# Patient Record
Sex: Male | Born: 1980
Health system: Southern US, Community
[De-identification: ages and names within clinical notes are randomized; demographics above are authoritative.]

## PROBLEM LIST (undated history)

## (undated) DIAGNOSIS — J189 Pneumonia, unspecified organism: Secondary | ICD-10-CM

## (undated) DIAGNOSIS — Z720 Tobacco use: Secondary | ICD-10-CM

## (undated) DIAGNOSIS — F419 Anxiety disorder, unspecified: Secondary | ICD-10-CM

## (undated) DIAGNOSIS — F32A Depression, unspecified: Secondary | ICD-10-CM

## (undated) HISTORY — DX: Anxiety disorder, unspecified: F41.9

## (undated) HISTORY — DX: Tobacco use: Z72.0

## (undated) HISTORY — DX: Depression, unspecified: F32.A

---

## 2006-07-23 ENCOUNTER — Inpatient Hospital Stay (HOSPITAL_COMMUNITY): Admission: EM | Admit: 2006-07-23 | Discharge: 2006-07-25 | Payer: Self-pay | Admitting: *Deleted

## 2006-07-23 ENCOUNTER — Ambulatory Visit: Payer: Self-pay | Admitting: Psychiatry

## 2012-02-10 ENCOUNTER — Other Ambulatory Visit: Payer: Self-pay

## 2012-02-10 ENCOUNTER — Emergency Department (HOSPITAL_COMMUNITY): Payer: BC Managed Care – PPO

## 2012-02-10 ENCOUNTER — Encounter (HOSPITAL_COMMUNITY): Payer: Self-pay | Admitting: *Deleted

## 2012-02-10 ENCOUNTER — Emergency Department (HOSPITAL_COMMUNITY)
Admission: EM | Admit: 2012-02-10 | Discharge: 2012-02-10 | Disposition: A | Discharge status: Home / Self Care | Payer: BC Managed Care – PPO | Attending: Emergency Medicine | Admitting: Emergency Medicine

## 2012-02-10 DIAGNOSIS — R0602 Shortness of breath: Secondary | ICD-10-CM | POA: Insufficient documentation

## 2012-02-10 DIAGNOSIS — R61 Generalized hyperhidrosis: Secondary | ICD-10-CM | POA: Insufficient documentation

## 2012-02-10 DIAGNOSIS — R42 Dizziness and giddiness: Secondary | ICD-10-CM | POA: Insufficient documentation

## 2012-02-10 DIAGNOSIS — R079 Chest pain, unspecified: Secondary | ICD-10-CM | POA: Insufficient documentation

## 2012-02-10 DIAGNOSIS — F17200 Nicotine dependence, unspecified, uncomplicated: Secondary | ICD-10-CM | POA: Insufficient documentation

## 2012-02-10 HISTORY — DX: Pneumonia, unspecified organism: J18.9

## 2012-02-10 LAB — POCT I-STAT, CHEM 8
BUN: 10 mg/dL (ref 6–23)
Calcium, Ion: 1.25 mmol/L (ref 1.12–1.32)
Chloride: 103 mEq/L (ref 96–112)
Creatinine, Ser: 0.9 mg/dL (ref 0.50–1.35)
Glucose, Bld: 97 mg/dL (ref 70–99)
Potassium: 4.1 mEq/L (ref 3.5–5.1)

## 2012-02-10 LAB — POCT I-STAT TROPONIN I: Troponin i, poc: 0 ng/mL (ref 0.00–0.08)

## 2012-02-10 MED ORDER — ASPIRIN 81 MG PO CHEW
324.0000 mg | CHEWABLE_TABLET | Freq: Once | ORAL | Status: AC
  Administered 2012-02-10: 324 mg via ORAL
  Filled 2012-02-10: qty 4

## 2012-02-10 MED ORDER — PROPOFOL 10 MG/ML IV EMUL: 5.0000 ug/kg/min | Freq: Once | INTRAVENOUS | Status: DC

## 2012-02-10 NOTE — ED Notes (Signed)
Pt states he was at work when he had sudden onset of right sided chest pain associated with nausea, diaphoresis, and dizziness. States pain last for several minutes but resolved on its on. Denies any chest pain at this time. States i just feel weird.

## 2012-02-10 NOTE — ED Provider Notes (Signed)
History     CSN: [id number]  Arrival date & time 02/10/12  4098   First MD Initiated Contact with Patient 02/10/12 1014      Chief Complaint  Patient presents with  . Chest Pain  . Shortness of Breath    (Consider location/radiation/quality/duration/timing/severity/associated sxs/prior treatment) Patient is a 31 y.o. male presenting with chest pain. The history is provided by the patient.  Chest Pain The chest pain began 1 - 2 hours ago. The chest pain is resolved. The pain is associated with exertion. At its most intense, the pain is at 6/10. The pain is currently at 0/10. The severity of the pain is moderate. The quality of the pain is described as sharp. The pain does not radiate. Primary symptoms include shortness of breath and dizziness. Pertinent negatives for primary symptoms include no fever and no palpitations.   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 States was moving some bins around when developed sudden onset of right sided chest pain. States it was sharp and stabbing. States he became diaphoretic, lightheaded, and developed shortness of breath. States sat down, rested for some time, and symptoms improved. Currently denies SOB or CP, states, "I just feel weird."  No medical problems, no medications, no history of the same. No recent surgeries or travel.   Past Medical History  Diagnosis Date  . Pneumonia     History reviewed. No pertinent past surgical history.  History reviewed. No pertinent family history.  History  Substance Use Topics  . Smoking status: Current Everyday Smoker  . Smokeless tobacco: Not on file  . Alcohol Use: No      Review of Systems  Constitutional: Negative for fever and chills.  Eyes: Negative.   Respiratory: Positive for chest tightness and shortness of breath.   Cardiovascular: Positive for chest pain. Negative for palpitations.  Gastrointestinal: Negative.   Genitourinary: Negative.   Musculoskeletal: Negative.   Skin:  Negative.   Neurological: Positive for dizziness and light-headedness. Negative for syncope.  Psychiatric/Behavioral: Negative.     Allergies  Review of patient's allergies indicates no known allergies.  Home Medications   Current Outpatient Rx  Name Route Sig Dispense Refill  . DIPHENHYDRAMINE HCL 25 MG PO CAPS Oral Take 50 mg by mouth at bedtime as needed. For sleep      BP 117/78  Pulse 78  Temp(Src) 98.1 F (36.7 C) (Oral)  Resp 16  SpO2 100%  Physical Exam  Nursing note and vitals reviewed. Constitutional: He is oriented to person, place, and time. He appears well-developed and well-nourished. No distress.  HENT:  Head: Normocephalic.  Eyes: Conjunctivae are normal.  Neck: Neck supple.  Cardiovascular: Normal rate, regular rhythm and normal heart sounds.   Pulmonary/Chest: Effort normal and breath sounds normal. No respiratory distress. He has no wheezes. He has no rales.  Abdominal: Soft. Bowel sounds are normal. There is no tenderness.  Musculoskeletal: Normal range of motion. He exhibits no edema.  Neurological: He is alert and oriented to person, place, and time.  Skin: Skin is warm and dry.  Psychiatric: He has a normal mood and affect.    ED Course  Procedures (including critical care time)  Pt with right sided CP, resolved now. PERC negative, pt with normal HR and O2sat. ECG unremarkable.    Date: 02/10/2012  Rate: 80  Rhythm: sinus arrhythmia  QRS Axis: normal  Intervals: normal  ST/T Wave abnormalities: normal  Conduction Disutrbances:none  Narrative Interpretation:   Old  EKG Reviewed: none available  Pt is low risk for ACS. However, given his exertional CP, nausea, diaphoresis, dizziness, will have to check two sets of enzymes. Will monitor.   Results for orders placed during the hospital encounter of 02/10/12  POCT I-STAT, CHEM 8      Component Value Range   Sodium 141  135 - 145 (mEq/L)   Potassium 4.1  3.5 - 5.1 (mEq/L)   Chloride 103  96  - 112 (mEq/L)   BUN 10  6 - 23 (mg/dL)   Creatinine, Ser 1.61  0.50 - 1.35 (mg/dL)   Glucose, Bld 97  70 - 99 (mg/dL)   Calcium, Ion 0.96  0.45 - 1.32 (mmol/L)   TCO2 28  0 - 100 (mmol/L)   Hemoglobin 15.6  13.0 - 17.0 (g/dL)   HCT 40.9  81.1 - 91.4 (%)  POCT I-STAT TROPONIN I      Component Value Range   Troponin i, poc 0.01  0.00 - 0.08 (ng/mL)   Comment 3           POCT I-STAT TROPONIN I      Component Value Range   Troponin i, poc 0.00  0.00 - 0.08 (ng/mL)   Comment 3            Dg Chest 2 View  02/10/2012  *RADIOLOGY REPORT*  Clinical Data: Chest pain and shortness of breath.  CHEST - 2 VIEW  Comparison: None.  Findings: Trachea is midline.  Heart size normal.  Nodular density in the left lower lung zone is most likely due to superimposition of vascular and rib shadows.  Lungs are hyperinflated but otherwise clear.  No pleural fluid.  IMPRESSION: No acute findings.  Original Report Authenticated By: Reyes Ivan, M.D.    2:48 PM Negative 2 sets of cardiac enzymes. CXR, labs are normal. VS have been normal entire time. Pt is symptom free. Possible anxiety attack vs acid reflux. Pt symptom free, agrees to go home with outpatient follow up.      No diagnosis found.    MDM          Lottie Mussel, PA 02/10/12 1450

## 2012-02-10 NOTE — Discharge Instructions (Signed)
Your chest x-ray, lab work, ECG, all normal today. There are no signs of any emergent conditions. There are multiple other causes that could explain your symptoms, however, if they continue, they will need to be re evaluated outpatient. Take tylenol or motrin for pain at home. You can try anti acids like pepcid. Rest. Follow up with primary care doctor or an urgent care for further evaluation and testing. Return if symptoms worsening.   Chest Pain (Nonspecific) It is often hard to give a specific diagnosis for the cause of chest pain. There is always a chance that your pain could be related to something serious, such as a heart attack or a blood clot in the lungs. You need to follow up with your caregiver for further evaluation. CAUSES   Heartburn.   Pneumonia or bronchitis.   Anxiety or stress.   Inflammation around your heart (pericarditis) or lung (pleuritis or pleurisy).   A blood clot in the lung.   A collapsed lung (pneumothorax). It can develop suddenly on its own (spontaneous pneumothorax) or from injury (trauma) to the chest.   Shingles infection (herpes zoster virus).  The chest wall is composed of bones, muscles, and cartilage. Any of these can be the source of the pain.  The bones can be bruised by injury.   The muscles or cartilage can be strained by coughing or overwork.   The cartilage can be affected by inflammation and become sore (costochondritis).  DIAGNOSIS  Lab tests or other studies, such as X-rays, electrocardiography, stress testing, or cardiac imaging, may be needed to find the cause of your pain.  TREATMENT   Treatment depends on what may be causing your chest pain. Treatment may include:   Acid blockers for heartburn.   Anti-inflammatory medicine.   Pain medicine for inflammatory conditions.   Antibiotics if an infection is present.   You may be advised to change lifestyle habits. This includes stopping smoking and avoiding alcohol, caffeine, and  chocolate.   You may be advised to keep your head raised (elevated) when sleeping. This reduces the chance of acid going backward from your stomach into your esophagus.   Most of the time, nonspecific chest pain will improve within 2 to 3 days with rest and mild pain medicine.  HOME CARE INSTRUCTIONS   If antibiotics were prescribed, take your antibiotics as directed. Finish them even if you start to feel better.   For the next few days, avoid physical activities that bring on chest pain. Continue physical activities as directed.   Do not smoke.   Avoid drinking alcohol.   Only take over-the-counter or prescription medicine for pain, discomfort, or fever as directed by your caregiver.   Follow your caregiver's suggestions for further testing if your chest pain does not go away.   Keep any follow-up appointments you made. If you do not go to an appointment, you could develop lasting (chronic) problems with pain. If there is any problem keeping an appointment, you must call to reschedule.  SEEK MEDICAL CARE IF:   You think you are having problems from the medicine you are taking. Read your medicine instructions carefully.   Your chest pain does not go away, even after treatment.   You develop a rash with blisters on your chest.  SEEK IMMEDIATE MEDICAL CARE IF:   You have increased chest pain or pain that spreads to your arm, neck, jaw, back, or abdomen.   You develop shortness of breath, an increasing cough, or you are  coughing up blood.   You have severe back or abdominal pain, feel nauseous, or vomit.   You develop severe weakness, fainting, or chills.   You have a fever.  THIS IS AN EMERGENCY. Do not wait to see if the pain will go away. Get medical help at once. Call your local emergency services (911 in [place] Do not drive yourself to the hospital. MAKE SURE YOU:   Understand these instructions.   Will watch your condition.   Will get help right away if you are not  doing well or get worse.  Document Released: 08/19/2005 Document Revised: 10/29/2011 Document Reviewed: 06/14/2008 [facility] Patient Information 2012 [place], [place].

## 2012-02-10 NOTE — ED Notes (Signed)
PT FAMILY HAS ARRIVED AT BEDSIDE. SINUS RHYTHM ON MONITOR. DENIES CP OR SOB

## 2012-02-10 NOTE — ED Notes (Signed)
Patient states while at work felt hot and co workers stated not hot in work area and had sudden right sided chest pain. Denies any pain currently 0/10 airway intact bilateral equal chest rise and fall. States "feels weird" currently told nurse does not want blood draw or IV started until seen by Doctor.  Resting comfortably on stretcher placed in a hospital gown.  Ax4 answering and following commands appropriate.

## 2012-02-10 NOTE — ED Notes (Signed)
MEAL TO PT

## 2012-02-10 NOTE — ED Notes (Signed)
PT AMBULATORY TO RESTROOM.

## 2012-02-10 NOTE — ED Notes (Signed)
PT AGREEABLE TO PLAN OF REPEAT LABS. PO FLUIDS TO PT. DENIES NAUSEA. SINUS RHYTHM ON MONITOR.

## 2012-02-11 NOTE — ED Provider Notes (Signed)
Medical screening examination/treatment/procedure(s) were performed by non-physician practitioner and as supervising physician I was immediately available for consultation/collaboration.   Earnestine Shipp A. Patrica Duel, MD 02/11/12 1105

## 2012-06-24 IMAGING — CR DG CHEST 2V
2 series · 2 of 2 positions shown · non-contrast
Comparison: None.

CLINICAL DATA: Chest pain and shortness of breath.

CHEST - 2 VIEW

[w chest pa]
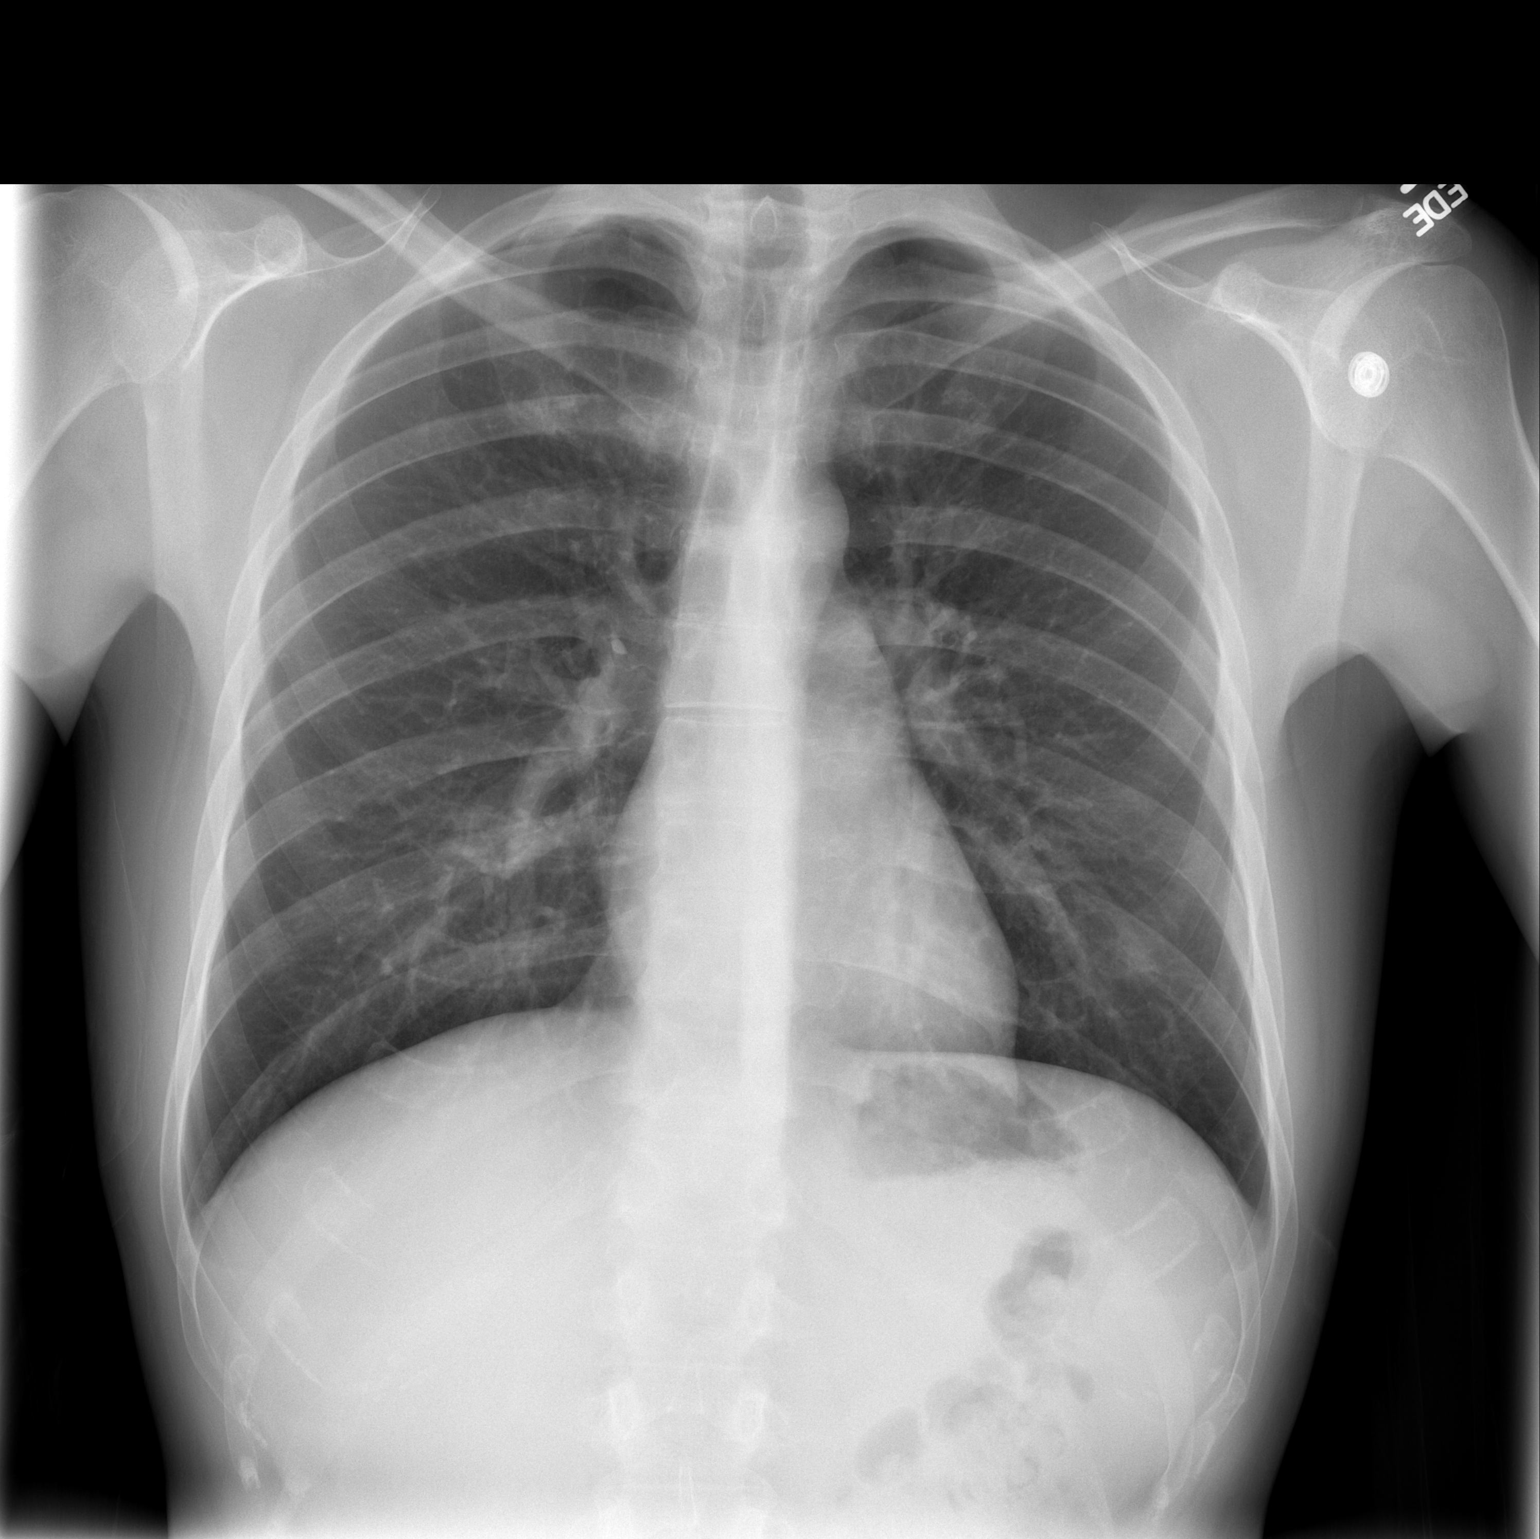

[w chest lat]
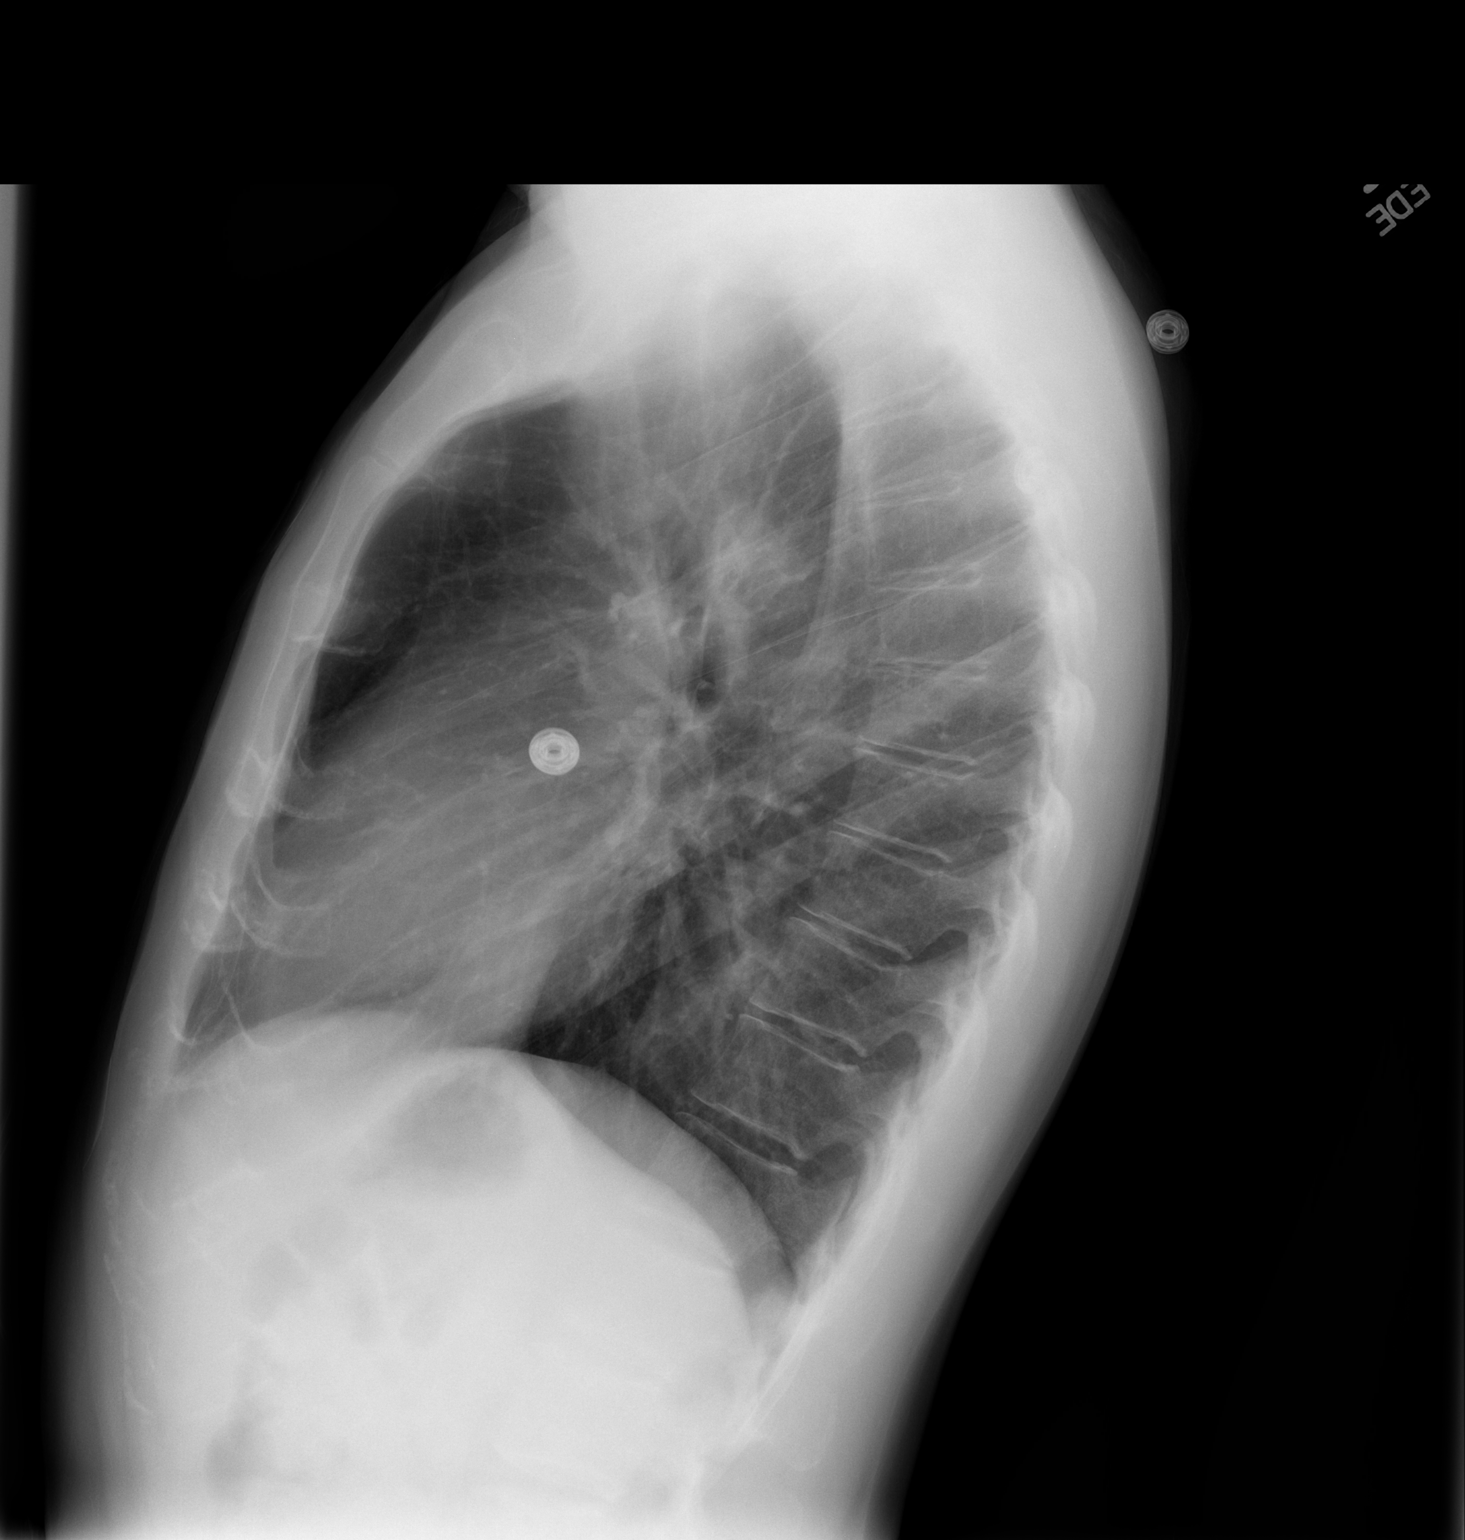

[2 of 2 positions shown; findings below may reference images not displayed]

FINDINGS: Trachea is midline.  Heart size normal.  Nodular density
in the left lower lung zone is most likely due to superimposition
of vascular and rib shadows.  Lungs are hyperinflated but otherwise
clear.  No pleural fluid.
IMPRESSION: No acute findings.

## 2017-12-03 DIAGNOSIS — H6692 Otitis media, unspecified, left ear: Secondary | ICD-10-CM | POA: Diagnosis not present

## 2018-05-27 DIAGNOSIS — L729 Follicular cyst of the skin and subcutaneous tissue, unspecified: Secondary | ICD-10-CM | POA: Diagnosis not present

## 2018-05-27 DIAGNOSIS — L089 Local infection of the skin and subcutaneous tissue, unspecified: Secondary | ICD-10-CM | POA: Diagnosis not present

## 2018-06-01 DIAGNOSIS — L723 Sebaceous cyst: Secondary | ICD-10-CM | POA: Diagnosis not present

## 2018-06-01 DIAGNOSIS — L089 Local infection of the skin and subcutaneous tissue, unspecified: Secondary | ICD-10-CM | POA: Diagnosis not present

## 2018-06-24 DIAGNOSIS — Z6821 Body mass index (BMI) 21.0-21.9, adult: Secondary | ICD-10-CM | POA: Diagnosis not present

## 2018-09-12 DIAGNOSIS — J069 Acute upper respiratory infection, unspecified: Secondary | ICD-10-CM | POA: Diagnosis not present

## 2019-07-27 DIAGNOSIS — R300 Dysuria: Secondary | ICD-10-CM | POA: Diagnosis not present

## 2019-07-27 DIAGNOSIS — R1084 Generalized abdominal pain: Secondary | ICD-10-CM | POA: Diagnosis not present

## 2019-07-27 DIAGNOSIS — R109 Unspecified abdominal pain: Secondary | ICD-10-CM | POA: Diagnosis not present

## 2019-08-02 DIAGNOSIS — Z6820 Body mass index (BMI) 20.0-20.9, adult: Secondary | ICD-10-CM | POA: Diagnosis not present

## 2019-08-02 DIAGNOSIS — R1084 Generalized abdominal pain: Secondary | ICD-10-CM | POA: Diagnosis not present

## 2019-08-02 DIAGNOSIS — R300 Dysuria: Secondary | ICD-10-CM | POA: Diagnosis not present

## 2019-08-02 DIAGNOSIS — K2970 Gastritis, unspecified, without bleeding: Secondary | ICD-10-CM | POA: Diagnosis not present

## 2019-08-23 DIAGNOSIS — R109 Unspecified abdominal pain: Secondary | ICD-10-CM | POA: Diagnosis not present

## 2019-08-23 DIAGNOSIS — R300 Dysuria: Secondary | ICD-10-CM | POA: Diagnosis not present

## 2019-08-23 DIAGNOSIS — R1084 Generalized abdominal pain: Secondary | ICD-10-CM | POA: Diagnosis not present

## 2020-08-16 DIAGNOSIS — R6889 Other general symptoms and signs: Secondary | ICD-10-CM | POA: Diagnosis not present

## 2020-08-16 DIAGNOSIS — F332 Major depressive disorder, recurrent severe without psychotic features: Secondary | ICD-10-CM | POA: Diagnosis not present

## 2020-08-16 DIAGNOSIS — F410 Panic disorder [episodic paroxysmal anxiety] without agoraphobia: Secondary | ICD-10-CM | POA: Diagnosis not present

## 2020-08-16 DIAGNOSIS — Z6820 Body mass index (BMI) 20.0-20.9, adult: Secondary | ICD-10-CM | POA: Diagnosis not present

## 2020-09-13 DIAGNOSIS — F332 Major depressive disorder, recurrent severe without psychotic features: Secondary | ICD-10-CM | POA: Diagnosis not present

## 2020-09-13 DIAGNOSIS — Z6820 Body mass index (BMI) 20.0-20.9, adult: Secondary | ICD-10-CM | POA: Diagnosis not present

## 2020-09-13 DIAGNOSIS — F410 Panic disorder [episodic paroxysmal anxiety] without agoraphobia: Secondary | ICD-10-CM | POA: Diagnosis not present

## 2020-12-20 DIAGNOSIS — R69 Illness, unspecified: Secondary | ICD-10-CM | POA: Diagnosis not present

## 2020-12-20 DIAGNOSIS — Z681 Body mass index (BMI) 19 or less, adult: Secondary | ICD-10-CM | POA: Diagnosis not present

## 2020-12-20 DIAGNOSIS — F429 Obsessive-compulsive disorder, unspecified: Secondary | ICD-10-CM | POA: Diagnosis not present

## 2020-12-20 DIAGNOSIS — F411 Generalized anxiety disorder: Secondary | ICD-10-CM | POA: Diagnosis not present

## 2021-01-17 DIAGNOSIS — R69 Illness, unspecified: Secondary | ICD-10-CM | POA: Diagnosis not present

## 2021-01-17 DIAGNOSIS — F209 Schizophrenia, unspecified: Secondary | ICD-10-CM | POA: Diagnosis not present

## 2021-03-21 DIAGNOSIS — F411 Generalized anxiety disorder: Secondary | ICD-10-CM | POA: Diagnosis not present

## 2021-03-21 DIAGNOSIS — R69 Illness, unspecified: Secondary | ICD-10-CM | POA: Diagnosis not present

## 2021-03-21 DIAGNOSIS — F209 Schizophrenia, unspecified: Secondary | ICD-10-CM | POA: Diagnosis not present

## 2021-03-28 DIAGNOSIS — I498 Other specified cardiac arrhythmias: Secondary | ICD-10-CM | POA: Diagnosis not present

## 2021-03-28 DIAGNOSIS — R69 Illness, unspecified: Secondary | ICD-10-CM | POA: Diagnosis not present

## 2021-03-28 DIAGNOSIS — R079 Chest pain, unspecified: Secondary | ICD-10-CM | POA: Diagnosis not present

## 2021-03-28 DIAGNOSIS — R0789 Other chest pain: Secondary | ICD-10-CM | POA: Diagnosis not present

## 2021-03-28 DIAGNOSIS — R197 Diarrhea, unspecified: Secondary | ICD-10-CM | POA: Diagnosis not present

## 2021-03-28 DIAGNOSIS — R112 Nausea with vomiting, unspecified: Secondary | ICD-10-CM | POA: Diagnosis not present

## 2021-03-28 DIAGNOSIS — R1013 Epigastric pain: Secondary | ICD-10-CM | POA: Diagnosis not present

## 2021-03-28 DIAGNOSIS — Z20822 Contact with and (suspected) exposure to covid-19: Secondary | ICD-10-CM | POA: Diagnosis not present

## 2021-03-28 DIAGNOSIS — R1110 Vomiting, unspecified: Secondary | ICD-10-CM | POA: Diagnosis not present

## 2021-06-20 DIAGNOSIS — R69 Illness, unspecified: Secondary | ICD-10-CM | POA: Diagnosis not present

## 2021-06-20 DIAGNOSIS — M791 Myalgia, unspecified site: Secondary | ICD-10-CM | POA: Diagnosis not present

## 2021-08-05 ENCOUNTER — Inpatient Hospital Stay (HOSPITAL_COMMUNITY)
Admission: RE | Admit: 2021-08-05 | Discharge: 2021-08-09 | DRG: 885 | Disposition: A | Discharge status: Home / Self Care | Payer: 59 | Attending: Emergency Medicine | Admitting: Emergency Medicine

## 2021-08-05 DIAGNOSIS — F17210 Nicotine dependence, cigarettes, uncomplicated: Secondary | ICD-10-CM | POA: Diagnosis present

## 2021-08-05 DIAGNOSIS — F411 Generalized anxiety disorder: Secondary | ICD-10-CM

## 2021-08-05 DIAGNOSIS — R69 Illness, unspecified: Secondary | ICD-10-CM | POA: Diagnosis not present

## 2021-08-05 DIAGNOSIS — F1320 Sedative, hypnotic or anxiolytic dependence, uncomplicated: Secondary | ICD-10-CM | POA: Diagnosis present

## 2021-08-05 DIAGNOSIS — Z9151 Personal history of suicidal behavior: Secondary | ICD-10-CM | POA: Diagnosis not present

## 2021-08-05 DIAGNOSIS — R45851 Suicidal ideations: Secondary | ICD-10-CM | POA: Diagnosis not present

## 2021-08-05 DIAGNOSIS — E876 Hypokalemia: Secondary | ICD-10-CM | POA: Diagnosis present

## 2021-08-05 DIAGNOSIS — Z20822 Contact with and (suspected) exposure to covid-19: Secondary | ICD-10-CM | POA: Diagnosis not present

## 2021-08-05 DIAGNOSIS — F410 Panic disorder [episodic paroxysmal anxiety] without agoraphobia: Secondary | ICD-10-CM | POA: Diagnosis present

## 2021-08-05 DIAGNOSIS — F429 Obsessive-compulsive disorder, unspecified: Secondary | ICD-10-CM | POA: Diagnosis present

## 2021-08-05 DIAGNOSIS — Z9152 Personal history of nonsuicidal self-harm: Secondary | ICD-10-CM | POA: Diagnosis not present

## 2021-08-05 DIAGNOSIS — F332 Major depressive disorder, recurrent severe without psychotic features: Secondary | ICD-10-CM | POA: Diagnosis present

## 2021-08-05 DIAGNOSIS — G4700 Insomnia, unspecified: Secondary | ICD-10-CM | POA: Diagnosis not present

## 2021-08-05 DIAGNOSIS — R634 Abnormal weight loss: Secondary | ICD-10-CM | POA: Diagnosis present

## 2021-08-05 NOTE — H&P (Addendum)
Behavioral Health Medical Screening Exam  Visit Diagnoses:   -MDD (major depressive disorder), recurrent severe, without psychosis (HCC)  -GAD (generalized anxiety disorder)  -Benzodiazepine dependence (HCC)  Brian Thomas is a 40 y.o. male with reported past psychiatric history of depression, anxiety, and OCD, who presents to the [facility]) accompanied by his brother  Brian Thomas: [phone]) and nephew as a voluntary walk-in for worsening depression, worsening SI, worsening anxiety, and "needing help getting off Ativan".  Per patient's request, patient unaccompanied during the evaluation.  Patient states that he came to the behavioral health Hospital because he has been having "super bad anxiety, OCD, and depression" over the past few weeks.  Patient reports that he has a history of depression, OCD, and anxiety but he states that these mental health symptoms have severely worsened over the past month and that he has "hit a wall" mentally.  Patient reports that he has been prescribed Ativan for anxiety for the past 2 years by his primary care provider.  Per chart review, it appears that patient has been prescribed Ativan 2 mg tablet with instructions to take 1 to 2 mg (0.5 - 1 tablet) p.o. every 8 hours as needed for anxiety.  Per PDMP review, there is documentation for patient being prescribed Ativan 1 mg tablets in 07/2020 and then Ativan 2 mg tablets from 07/2020 up until 07/2021, with most recent prescription being for 30-day supply of 90 Ativan 2 mg tablets, which was filled on 07/27/2021.  Per PDMP review, patient's Ativan prescriptions were prescribed by 1 provider from September 2021 until April 2022 and then patient's Ativan prescriptions have been prescribed by a different provider from May 2022 up until currently (September 2022).  Patient states that although his Ativan prescription is for 1-2 mg PO q8H PRN, he has been taking Ativan 2 mg p.o. every 8 hours scheduled (6 mg  every 24 hrs/daily) for the past 2 years due to his anxiety as well as due to his reported dependence on this medication.  Patient denies diversion or abuse of his Ativan, but patient does state that he is physically dependent on Ativan.    Patient also states that he believes that the Ativan is "eating at his body", has not been helping with his depression or anxiety and has been making him feel worse mentally as well.  When patient is asked to provide further details regarding this, patient states that his energy is completely depleted, he is "a shell of a person", "has no sex drive", and has no appetite and he believes that his continued Ativan use has some contributory factor to the symptoms he is experiencing. When patient is asked how long he has been experiencing these symptoms mentioned above, patient states "too long" and does not provide further details.  Patient states in addition to the Ativan issues, his additional stressors that are contributing to his depression and anxiety include the following: Patient reports that his father died about 1 year ago and he has been struggling with grieving the loss of his father, patient reports that he has been divorced for the past few years and continues to struggle with dealing with the divorce, patient states that his current job is overworking him and has no backup support for him which makes it impossible for him to take any time off from work, patient reports that he has an 10-year-old daughter that stays with him on Fridays, Saturdays, and Sundays and he states that due to his depression  and anxiety, he has not been able to be as present for his daughter or spend as much time with his daughter as he would like to during their time together.  Patient states that he tried to stop using Ativan completely 2 days ago on 08/03/21, but patient states that he only made about 24 hours without any Ativan before he had to resume using his Ativan due to his physical  dependence/withdrawal symptoms.  Patient endorses history of the following withdrawal symptoms from Ativan: "Jitteriness", tremor, nervousness, and nausea.  He denies history of any additional benzodiazepine withdrawal symptoms.  However, when patient is asked about history of additional benzodiazepine withdrawal symptoms, he states that he does not have history of any additional withdrawal symptoms but does state that he has not gone long enough without Ativan/benzodiazepines in the past in order to determine if he has additional withdrawal symptoms or not from benzodiazepines.  Patient states that the longest period of time he has gone without taking any Ativan was 24 hours, which occurred on 08/03/21 (see details above).  He denies history of seizures.  He endorses some tremor and nervousness currently, but he denies any chest pain, shortness of breath, headache, lightheadedness, dizziness, palpitations, abdominal pain, nausea, vomiting, or any additional physical symptoms on exam at this time.  Patient reports that his last Ativan use was 2 mg at 6:00 PM earlier this evening on 08/05/2021 and he states that thus far, he has taken a total daily dose of 6 mg for today, 08/05/2021.  Patient endorses passive SI "every day all day" for the past 20 years and endorses passive SI with no intent or plan currently on exam, stating that "this is not a life worth living".  He states that his SI has gotten worse recently due to his issues with Ativan use and the additional stressors mentioned above.  When patient is asked about any suicidal plans, patient states "I think I know what I would do", but patient does not provide further details and denies any specific suicidal plans or intent.  He endorses 1 past suicide attempt in 2007 in which she states that he overdosed on Xanax at that time.  Patient denies history of any additional suicide attempts.  He endorses history of self-injurious behavior via intentionally burning  himself, but he states that he has not intentionally burned himself since 2007/2008.  He denies history of self-injurious behavior via cutting.  He denies HI.  He denies AVH, but does endorse "intrusive thoughts" but does not expound upon this further.  Patient endorses chronic feelings of paranoia, stating that whenever he has a conversation with someone, he is constantly thinking "what are these people trying to get out of me?".  Regarding patient's paranoia, patient also states that he is constantly paranoid that he is "going to get pushed out" and fired from his job.  Patient reports experiencing daily panic attacks that last 10 to 15 minutes.  He states that his last panic attack was a few days ago.  Patient endorses feelings of tachycardia, palpitations, dizziness, and hyperventilation associated with these panic attacks, but patient denies any of these symptoms currently on exam at this time.  Patient reports poor sleep, about 4 to 5 hours per night and patient reports that when he takes Ativan at night, his sleep increases from 6 to 7 hours per night.  Patient endorses intermittently waking up throughout the night.  He also reports having "nonsensical nightmares" often at night and he states that  his nightmares are not attributed to any actual life experiences or past trauma.  He endorses anhedonia as well as worsening feelings of guilt, hopelessness, and worthlessness.  He endorses extreme fatigue, poor/worsening concentration, decreased appetite, and 15 pound weight loss over the past year.  Patient states that his appetite is so decreased to the point that he has to force himself to try to eat. Patient reports that he he has OCD.  However, upon questioning patient about details regarding OCD, patient denies engaging in repetitive/compulsive behaviors.  In addition to taking Ativan 2 mg p.o. every 8 hours (see details above), patient states that he is also prescribed Abilify 5 mg p.o. daily by his PCP,  although patient states that he has actually only been taking Abilify 2.5 mg p.o. daily (patient states that he has been taking half of 1 5 mg tablet daily).  Patient states that the Abilify has been making him feel worse mentally and he states that he does not think the Abilify is helping.  He states that he no longer wants to take the Abilify.  Patient reports that he was previously prescribed Lexapro 10 mg p.o. daily by his PCP, but patient states that he only took the medication twice, did not like the way the medication made him feel, and he states that he discontinued the medication.  He reports that he has not taken Lexapro for a few months now. Patient states that he is not prescribed any additional psychotropic medications or any additional medications at this time.  He reports that he is not taking any additional home medications at this time.  Patient states that he took Risperdal in the past "a long time ago", the patient denies history of taking any additional psychotropic medications in the past.  Patient states that he does not have an outpatient psychiatrist or therapist at this time and he reports that he has never seen an outpatient psychiatrist or therapist.  Per chart review, patient was admitted to [facility] behavioral [facility] from 07/23/2006 to 07/25/2006.  Patient denies history of any additional inpatient psychiatric hospitalizations.  Patient lives alone in [place].  His daughter stays with him on Friday, Saturday, and Sunday during the week.  Patient states that he does not own any firearms and he denies any firearms in his home.  Patient does state that he does have access to his father's gun collection, in which patient states that there are multiple guns he has access to in his father's home which is 15 minutes away from his home.  Patient does endorse fleeting thoughts of wanting to use 1 of these firearms on himself and he states that he last had these thoughts earlier today  on 08/05/2021.  He denies ever having any thoughts of wanting to use firearms on anyone else.  Patient is currently employed as a Production designer, theatre/television/film at [employer].  He denies alcohol use.  He endorses smoking greater than 1 pack/day of cigarettes for the past few years and he endorses smoking cigarettes for 20 years.  He reports that he smoked an unknown amount of delta 8 last Friday, 08/01/2021.  He endorses smoking an unknown amount of delta 8 a few days per week for the past few months.  He denies history of any additional substance use.  Patient states that he does not have any sources of support at this time.  On exam, patient is sitting in a chair, appearing restless and tearful at times throughout the evaluation, but  in no acute distress.  Patient's mood is anxious and depressed with congruent affect.  Speech is clear and coherent with normal rate.  Speech volume is decreased.  Patient is alert and oriented x4, cooperative, and answers all questions appropriately.  No indication that patient is responding to internal or external stimuli on exam.  Total Time spent with patient: 30 minutes  Psychiatric Specialty Exam:  Presentation  General Appearance: Disheveled  Eye Contact:Good  Speech:Clear and Coherent; Normal Rate  Speech Volume:Decreased  Handedness: No data recorded  Mood and Affect  Mood:Anxious; Depressed  Affect:Congruent; Tearful   Thought Process  Thought Processes:Coherent; Goal Directed; Linear  Descriptions of Associations:Intact  Orientation:Full (Time, Place and Person)  Thought Content:Logical; WDL; Paranoid Ideation  History of Schizophrenia/Schizoaffective disorder:No  Duration of Psychotic Symptoms:N/A Hallucinations:Hallucinations: None  Ideas of Reference:Paranoia  Suicidal Thoughts:Suicidal Thoughts: Yes, Passive SI Passive Intent and/or Plan: Without Intent; Without Plan; Without Means to Carry Out; With Access to Means  Homicidal  Thoughts:Homicidal Thoughts: No   Sensorium  Memory:Immediate Good; Recent Good; Remote Good  Judgment:Fair  Insight:Fair   Executive Functions  Concentration:Fair  Attention Span:Fair  Recall:Good  Fund of Knowledge:Good  Language:Good   Psychomotor Activity  Psychomotor Activity:Psychomotor Activity: Restlessness   Assets  Assets:Communication Skills; Desire for Improvement; Financial Resources/Insurance; Housing; Leisure Time; Physical Health; Resilience; Social Support; Vocational/Educational   Sleep  Sleep:Sleep: Poor Number of Hours of Sleep: 4    Physical Exam: Physical Exam Vitals reviewed.  Constitutional:      General: He is not in acute distress.    Appearance: He is not ill-appearing, toxic-appearing or diaphoretic.  HENT:     Head: Normocephalic and atraumatic.     Right Ear: External ear normal.     Left Ear: External ear normal.     Nose: Nose normal.  Eyes:     Conjunctiva/sclera: Conjunctivae normal.  Pulmonary:     Effort: Pulmonary effort is normal. No respiratory distress.  Musculoskeletal:        General: Normal range of motion.     Cervical back: Normal range of motion.  Neurological:     General: No focal deficit present.     Mental Status: He is alert and oriented to person, place, and time.     Comments: Moderate tremor noted of bilateral hands upon bilateral arm extension.   Psychiatric:        Attention and Perception: He does not perceive auditory or visual hallucinations.        Mood and Affect: Mood is anxious and depressed.        Speech: Speech normal.        Behavior: Behavior is withdrawn. Behavior is not agitated, slowed, aggressive or combative. Behavior is cooperative.        Thought Content: Thought content is paranoid. Thought content is not delusional. Thought content does not include homicidal or suicidal ideation.     Comments: Affect mood congruent and tearful.    Review of Systems  Constitutional:   Positive for malaise/fatigue and weight loss. Negative for chills, diaphoresis and fever.  HENT:  Negative for congestion.   Respiratory:  Negative for cough and shortness of breath.   Cardiovascular:  Positive for palpitations. Negative for chest pain.  Gastrointestinal:  Positive for nausea. Negative for abdominal pain, constipation, diarrhea and vomiting.  Musculoskeletal:  Negative for joint pain and myalgias.  Neurological:  Positive for tremors. Negative for dizziness, seizures and headaches.  Psychiatric/Behavioral:  Positive for depression and suicidal  ideas. Negative for hallucinations and memory loss. The patient is nervous/anxious and has insomnia.   All other systems reviewed and are negative. Patient endorses history of the following withdrawal symptoms from Ativan: "Jitteriness", tremor, nervousness, and nausea.  He denies history of any additional benzodiazepine withdrawal symptoms.  However, when patient is asked about history of additional benzodiazepine withdrawal symptoms, he states that he does not have history of any additional withdrawal symptoms but does state that he has not gone long enough without Ativan/benzodiazepines in the past in order to determine if he has additional withdrawal symptoms or not from benzodiazepines.  Patient states that the longest period of time he has gone without taking any Ativan was 24 hours, which occurred on 08/03/21 (see details above). He denies history of seizures.  He endorses some tremor and nervousness currently, but he denies any chest pain, shortness of breath, headache, lightheadedness, dizziness, palpitations, abdominal pain, nausea, vomiting, or any additional physical symptoms on exam at this time. Patient reports experiencing daily panic attacks that last 10 to 15 minutes.  He states that his last panic attack was a few days ago.  Patient endorses feelings of tachycardia, palpitations, dizziness, and hyperventilation associated with these  panic attacks, but patient denies any of these symptoms currently on exam at this time.   Vitals: Blood pressure 117/88, pulse 83, temperature 98.2 F (36.8 C), temperature source Oral, resp. rate 18, height 5\' 10"  (1.778 m), weight 61.2 kg, SpO2 100 %. Body mass index is 19.37 kg/m.  Musculoskeletal: Strength & Muscle Tone: within normal limits Gait & Station: normal Patient leans: N/A   Recommendations:  Based on my evaluation the patient does not appear to have an emergency medical condition.  Based on patient's current presentation, patient appears to be experiencing severe worsening of his depression and anxiety, worsening suicidal thoughts, as well as benzodiazepine dependence that is severely negatively impacting his ability to function in his activities of daily living. Thus, believe that the patient meets inpatient psychiatric treatment criteria.  Recommend inpatient psychiatric treatment for the patient.  Patient is agreeable to inpatient psychiatric treatment. With patient's consent, patient's borther and nephew updated on recommendation for inpatient psychiatric treatment. Per [facility], patient is conditionally accepted to [facility] for inpatient psychiatric treatment pending negative COVID PCR test result.  PCR COVID test ordered for admission: Results pending.   Update: Patient's PCR COVID test is negative. Patient is accepted to [facility] for inpatient psychiatric treatment. Admission Orders placed, including labs/tests and medications. Please see my Admission H&P Note for further details regarding labs/tests and medication orders for patient's admission.   Jaclyn Shaggy, PA-C 08/06/2021, 2:45 AM
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
## 2021-08-06 ENCOUNTER — Encounter (HOSPITAL_COMMUNITY): Payer: Self-pay | Admitting: Psychiatry

## 2021-08-06 ENCOUNTER — Other Ambulatory Visit: Payer: Self-pay

## 2021-08-06 ENCOUNTER — Encounter (HOSPITAL_COMMUNITY): Payer: Self-pay

## 2021-08-06 DIAGNOSIS — F332 Major depressive disorder, recurrent severe without psychotic features: Principal | ICD-10-CM

## 2021-08-06 LAB — HEPATITIS PANEL, ACUTE
HCV Ab: NONREACTIVE
Hep A IgM: NONREACTIVE
Hep B C IgM: NONREACTIVE
Hepatitis B Surface Ag: NONREACTIVE

## 2021-08-06 LAB — COMPREHENSIVE METABOLIC PANEL
ALT: 30 U/L (ref 0–44)
AST: 26 U/L (ref 15–41)
Albumin: 4.1 g/dL (ref 3.5–5.0)
Alkaline Phosphatase: 65 U/L (ref 38–126)
Anion gap: 7 (ref 5–15)
BUN: 13 mg/dL (ref 6–20)
CO2: 28 mmol/L (ref 22–32)
Calcium: 9.3 mg/dL (ref 8.9–10.3)
Chloride: 105 mmol/L (ref 98–111)
Creatinine, Ser: 0.66 mg/dL (ref 0.61–1.24)
GFR, Estimated: >60 mL/min (ref >60)
Glucose, Bld: 93 mg/dL (ref 70–99)
Potassium: 3.4 mmol/L — ABNORMAL LOW (ref 3.5–5.1)
Sodium: 140 mmol/L (ref 135–145)
Total Bilirubin: 0.6 mg/dL (ref 0.3–1.2)
Total Protein: 6.8 g/dL (ref 6.5–8.1)

## 2021-08-06 LAB — CBC
HCT: 45 % (ref 39.0–52.0)
Hemoglobin: 15.2 g/dL (ref 13.0–17.0)
MCH: 32.5 pg (ref 26.0–34.0)
MCHC: 33.8 g/dL (ref 30.0–36.0)
MCV: 96.4 fL (ref 80.0–100.0)
Platelets: 219 10*3/uL (ref 150–400)
RBC: 4.67 MIL/uL (ref 4.22–5.81)
RDW: 12.1 % (ref 11.5–15.5)
WBC: 8.7 10*3/uL (ref 4.0–10.5)
nRBC: 0 % (ref 0.0–0.2)

## 2021-08-06 LAB — RESP PANEL BY RT-PCR (RSV, FLU A&B, COVID)  RVPGX2
Influenza A by PCR: NEGATIVE
Influenza B by PCR: NEGATIVE
Resp Syncytial Virus by PCR: NEGATIVE
SARS Coronavirus 2 by RT PCR: NEGATIVE

## 2021-08-06 LAB — LIPID PANEL
Cholesterol: 169 mg/dL (ref 0–200)
HDL: 39 mg/dL — ABNORMAL LOW (ref >40)
LDL Cholesterol: 116 mg/dL — ABNORMAL HIGH (ref 0–99)
Total CHOL/HDL Ratio: 4.3 RATIO
Triglycerides: 68 mg/dL (ref <150)
VLDL: 14 mg/dL (ref 0–40)

## 2021-08-06 LAB — RAPID URINE DRUG SCREEN, HOSP PERFORMED
Amphetamines: NOT DETECTED
Barbiturates: NOT DETECTED
Benzodiazepines: POSITIVE — AB
Cocaine: NOT DETECTED
Opiates: NOT DETECTED
Tetrahydrocannabinol: POSITIVE — AB

## 2021-08-06 LAB — GLUCOSE, CAPILLARY: Glucose-Capillary: 88 mg/dL (ref 70–99)

## 2021-08-06 LAB — HEMOGLOBIN A1C
Hgb A1c MFr Bld: 4.6 % — ABNORMAL LOW (ref 4.8–5.6)
Mean Plasma Glucose: 85.32 mg/dL

## 2021-08-06 LAB — RPR: RPR Ser Ql: NONREACTIVE

## 2021-08-06 LAB — TSH: TSH: 1.023 u[IU]/mL (ref 0.350–4.500)

## 2021-08-06 LAB — HIV ANTIBODY (ROUTINE TESTING W REFLEX): HIV Screen 4th Generation wRfx: NONREACTIVE

## 2021-08-06 MED ORDER — ACETAMINOPHEN 325 MG PO TABS: 650.0000 mg | ORAL_TABLET | Freq: Four times a day (QID) | ORAL | Status: DC | PRN

## 2021-08-06 MED ORDER — LORAZEPAM 1 MG PO TABS: 1.0000 mg | ORAL_TABLET | Freq: Four times a day (QID) | ORAL | Status: DC | PRN

## 2021-08-06 MED ORDER — TRAZODONE HCL 50 MG PO TABS
50.0000 mg | ORAL_TABLET | Freq: Every evening | ORAL | Status: DC | PRN
  Administered 2021-08-06 – 2021-08-07 (×2): 50 mg via ORAL
  Filled 2021-08-06 (×2): qty 1

## 2021-08-06 MED ORDER — NICOTINE POLACRILEX 2 MG MT GUM
2.0000 mg | CHEWING_GUM | OROMUCOSAL | Status: DC | PRN
  Administered 2021-08-06 – 2021-08-09 (×13): 2 mg via ORAL
  Filled 2021-08-06 (×11): qty 1

## 2021-08-06 MED ORDER — MAGNESIUM HYDROXIDE 400 MG/5ML PO SUSP: 30.0000 mL | Freq: Every day | ORAL | Status: DC | PRN

## 2021-08-06 MED ORDER — POTASSIUM CHLORIDE CRYS ER 20 MEQ PO TBCR
20.0000 meq | EXTENDED_RELEASE_TABLET | Freq: Two times a day (BID) | ORAL | Status: AC
  Administered 2021-08-06 – 2021-08-07 (×3): 20 meq via ORAL
  Filled 2021-08-06 (×3): qty 1

## 2021-08-06 MED ORDER — LORAZEPAM 1 MG PO TABS: 1.0000 mg | ORAL_TABLET | Freq: Three times a day (TID) | ORAL | Status: DC

## 2021-08-06 MED ORDER — LORAZEPAM 1 MG PO TABS: 1.0000 mg | ORAL_TABLET | Freq: Every day | ORAL | Status: DC

## 2021-08-06 MED ORDER — ENSURE ENLIVE PO LIQD
237.0000 mL | ORAL | Status: DC
  Administered 2021-08-07: 237 mL via ORAL
  Filled 2021-08-06 (×6): qty 237

## 2021-08-06 MED ORDER — ENSURE ENLIVE PO LIQD
237.0000 mL | Freq: Two times a day (BID) | ORAL | Status: DC
  Filled 2021-08-06 (×3): qty 237

## 2021-08-06 MED ORDER — ALUM & MAG HYDROXIDE-SIMETH 200-200-20 MG/5ML PO SUSP
30.0000 mL | ORAL | Status: DC | PRN
Start: 2021-08-06 — End: 2021-08-09

## 2021-08-06 MED ORDER — ADULT MULTIVITAMIN W/MINERALS CH
1.0000 | ORAL_TABLET | Freq: Every day | ORAL | Status: DC
Start: 2021-08-06 — End: 2021-08-09
  Administered 2021-08-06 – 2021-08-09 (×4): 1 via ORAL
  Filled 2021-08-06 (×8): qty 1

## 2021-08-06 MED ORDER — ONDANSETRON 4 MG PO TBDP: 4.0000 mg | ORAL_TABLET | Freq: Four times a day (QID) | ORAL | Status: AC | PRN

## 2021-08-06 MED ORDER — LORAZEPAM 1 MG PO TABS: 1.0000 mg | ORAL_TABLET | Freq: Two times a day (BID) | ORAL | Status: DC

## 2021-08-06 MED ORDER — THIAMINE HCL 100 MG PO TABS
100.0000 mg | ORAL_TABLET | Freq: Every day | ORAL | Status: DC
  Administered 2021-08-07 – 2021-08-09 (×3): 100 mg via ORAL
  Filled 2021-08-06 (×6): qty 1

## 2021-08-06 MED ORDER — LOPERAMIDE HCL 2 MG PO CAPS: 2.0000 mg | ORAL_CAPSULE | ORAL | Status: AC | PRN

## 2021-08-06 MED ORDER — LORAZEPAM 1 MG PO TABS
1.0000 mg | ORAL_TABLET | Freq: Four times a day (QID) | ORAL | Status: DC
  Administered 2021-08-06 (×3): 1 mg via ORAL
  Filled 2021-08-06 (×4): qty 1

## 2021-08-06 MED ORDER — THIAMINE HCL 100 MG PO TABS
100.0000 mg | ORAL_TABLET | Freq: Every day | ORAL | Status: DC
  Filled 2021-08-06: qty 1

## 2021-08-06 MED ORDER — HYDROXYZINE HCL 25 MG PO TABS
25.0000 mg | ORAL_TABLET | Freq: Four times a day (QID) | ORAL | Status: DC | PRN
  Administered 2021-08-06 – 2021-08-07 (×3): 25 mg via ORAL
  Filled 2021-08-06 (×3): qty 1

## 2021-08-06 NOTE — Progress Notes (Signed)
Adult Psychoeducational Group Note  Date:  08/06/2021 Time:  4:58 PM  Group Topic/Focus:  Goals Group:   The focus of this group is to help patients establish daily goals to achieve during treatment and discuss how the patient can incorporate goal setting into their daily lives to aide in recovery.  Participation Level:  Active  Participation Quality:  Appropriate and Attentive  Affect:  Appropriate  Cognitive:  Appropriate  Insight: Appropriate  Engagement in Group:  Engaged  Modes of Intervention:  Activity  Additional Comments:   Pt attended the personal development group and remained appropriate and engaged throughout the duration of the group.   Sheran Lawless 08/06/2021, 4:58 PM

## 2021-08-06 NOTE — Group Note (Signed)
LCSW Group Therapy Note   Group Date: 08/06/2021 Start Time: 1300 End Time: 1400    Type of Therapy and Topic: Group Therapy: Effective Communication   Participation Level: Active    Description of Group:  In this group patients will be asked to identify their own styles of communication as well as defining and identifying passive, assertive, and aggressive styles of communication. Participants will identify strategies to communicate in a more assertive manner in an effort to appropriately meet their needs. This group will be process-oriented, with patients participating in exploration of their own experiences as well as giving and receiving support and challenge from other group members.   Therapeutic Goals: 1. Patient will identify their personal communication style. 2. Patient will identify passive, assertive, and aggressive forms of communication. 3. Patient will identify strategies for developing more effective communication to appropriately meet their needs.    Summary of Patient Progress:  Brian Thomas states that he feels that he is effective at communication but sometimes is not good at picking the right words or is not certain that he has chosen the right words. The Pt accepted the worksheet and participated in open discussion/communication with their peers.     Therapeutic Modalities:  Communication Skills Solution Focused Therapy Motivational Interviewing  Aram Beecham, [place] 08/06/2021  2:09 PM

## 2021-08-06 NOTE — Progress Notes (Signed)
Order noted for UA.  Specimen cup given to patient with instructions provided and understanding verbalized. 

## 2021-08-06 NOTE — Progress Notes (Signed)
Pt did not attend group after verbal prompt. 

## 2021-08-06 NOTE — Progress Notes (Signed)
   08/06/21 1000  Psych Admission Type (Psych Patients Only)  Admission Status Voluntary  Psychosocial Assessment  Patient Complaints Depression  Eye Contact Fair  Facial Expression Anxious  Affect Anxious;Appropriate to circumstance  Speech Logical/coherent  Interaction Assertive  Motor Activity Slow  Appearance/Hygiene Unremarkable  Behavior Characteristics Appropriate to situation  Mood Depressed  Thought Process  Coherency WDL  Content WDL;Paranoia  Delusions None reported or observed  Perception WDL  Hallucination None reported or observed  Judgment Poor  Confusion None  Danger to Self  Current suicidal ideation? Denies  Danger to Others  Danger to Others None reported or observed  Dar Note: Patient presents with a blunted affect and depressed mood.  Denies suicidal thoughts, auditory and visual hallucinations.  Refused his morning Ativan.  MD made aware.  Stated that he is here to get off Ativan.  Support and encouragement offered as needed.  Patient is safe on and off the unit.

## 2021-08-06 NOTE — Progress Notes (Signed)
NUTRITION ASSESSMENT RD working remotely.  Pt identified as at risk on the Malnutrition Screen Tool  INTERVENTION: - continue Ensure Plus but will change from BID to once/day, each supplement provides 350 kcal and 13 grams of protein. - will order 1 tablet multivitamin with minerals/day. [facility] staff to encourage PO intakes of meals and snacks.  NUTRITION DIAGNOSIS: Unintentional weight loss related to sub-optimal intake as evidenced by pt report.   Goal: Pt to meet >/= 90% of their estimated nutrition needs.  Monitor:  PO intake  Assessment:  Patient admitted for MDD without psychosis, worsening depression, worsening anxiety.  Weight today is documented as 132 lb and appears to be a stated weight. PTA the only other weight documented in the chart was 157 lb at [facility] on 08/30/13.  40 y.o. male  Height: Ht Readings from Last 1 Encounters:  08/06/21 5\' 10"  (1.778 m)    Weight: Wt Readings from Last 1 Encounters:  08/06/21 59.9 kg    Weight Hx: Wt Readings from Last 10 Encounters:  08/06/21 59.9 kg    BMI:  Body mass index is 18.94 kg/m. Pt meets criteria for normal weight based on current BMI.  Estimated Nutritional Needs: Kcal: 25-30 kcal/kg Protein: > 1 gram protein/kg Fluid: 1 ml/kcal  Diet Order:  Diet Order             Diet regular Room service appropriate? Yes; Fluid consistency: Thin  Diet effective now                  Pt is also offered choice of unit snacks mid-morning and mid-afternoon.  Pt is eating as desired.   Lab results and medications reviewed.     08/08/21, MS, RD, LDN, CNSC Inpatient Clinical Dietitian RD pager # available in [facility]  After hours/weekend pager # available in [facility]

## 2021-08-06 NOTE — Progress Notes (Addendum)
   08/06/21 0218  Psych Admission Type (Psych Patients Only)  Admission Status Voluntary  Psychosocial Assessment  Patient Complaints Anxiety;Depression;Hopelessness;Helplessness;Appetite decrease;Decreased concentration  Eye Contact Fair  Facial Expression Anxious  Affect Anxious;Appropriate to circumstance  Speech Logical/coherent  Interaction Assertive  Motor Activity Slow  Appearance/Hygiene Unremarkable  Behavior Characteristics Cooperative;Appropriate to situation;Anxious  Mood Anxious;Depressed;Pleasant  Thought Process  Coherency WDL  Content WDL;Paranoia  Delusions None reported or observed  Perception WDL  Hallucination None reported or observed  Judgment Poor  Confusion None  Danger to Self  Current suicidal ideation? Denies  Danger to Others  Danger to Others None reported or observed   Pt denies current SI and wants to discontinue use of Ativan while he is here. Pt not in favor of medications in general. "I don't like the idea of taking medication or how it makes me feel." Pt feels his life has taken a downward spiral and wants to get some help.

## 2021-08-06 NOTE — Progress Notes (Deleted)
Pt was engaged in group.

## 2021-08-06 NOTE — Plan of Care (Signed)
Patient seen after morning team meeting. He initially refused his morning Ativan, but was advised to take it so that his BZD taper can be be safe. He was agreeable. This morning, he feels "ok." He endorses chronic SI, without intent nor plan, but says that flashes of him committing suicide come into his mind but are easy to rid; these began 2 years ago and are no longer distressing. He feels an obligation to his daughter, aunt, and brother, so he lives for them. He denies HI/AVH. He also endorses chronic paranoia, and feels a sense of "impending doom," which he still feels today, but not as much. He says that when he leaves here, he would be open to therapy but prefers mindfulness/meditation. When advised to take part in both, he was agreeable. He would rather not start psychotropics at this time. He will continue the Ativan taper as scheduled.   Lamar Sprinkles, MD PGY-1 08/06/2021 [facility] Department of Psychiatry

## 2021-08-06 NOTE — BH IP Treatment Plan (Signed)
Interdisciplinary Treatment and Diagnostic Plan Update  08/06/2021 Time of Session: 9:00am  Brian Thomas MRN: [id number]  Principal Diagnosis: MDD (major depressive disorder), recurrent severe, without psychosis ([place])  Secondary Diagnoses: Principal Problem:   MDD (major depressive disorder), recurrent severe, without psychosis ([place]) Active Problems:   GAD (generalized anxiety disorder)   Benzodiazepine dependence ([place])   Current Medications:  Current Facility-Administered Medications  Medication Dose Route Frequency Provider Last Rate Last Admin   acetaminophen (TYLENOL) tablet 650 mg  650 mg Oral Q6H PRN Prescilla Sours, PA-C       alum & mag hydroxide-simeth (MAALOX/MYLANTA) 200-200-20 MG/5ML suspension 30 mL  30 mL Oral Q4H PRN Margorie John W, PA-C       feeding supplement (ENSURE ENLIVE / ENSURE PLUS) liquid 237 mL  237 mL Oral Q24H Nelda Marseille, Amy E, MD       hydrOXYzine (ATARAX/VISTARIL) tablet 25 mg  25 mg Oral Q6H PRN Arthor Captain, MD   25 mg at 08/06/21 1253   loperamide (IMODIUM) capsule 2-4 mg  2-4 mg Oral PRN Margorie John W, PA-C       LORazepam (ATIVAN) tablet 1 mg  1 mg Oral QID Margorie John W, PA-C   1 mg at 08/06/21 1118   Followed by   Derrill Memo ON 08/07/2021] LORazepam (ATIVAN) tablet 1 mg  1 mg Oral TID Prescilla Sours, PA-C       Followed by   Derrill Memo ON 08/08/2021] LORazepam (ATIVAN) tablet 1 mg  1 mg Oral BID Margorie John W, PA-C       Followed by   Derrill Memo ON 08/09/2021] LORazepam (ATIVAN) tablet 1 mg  1 mg Oral Daily Lovena Le, [name] W, PA-C       LORazepam (ATIVAN) tablet 1 mg  1 mg Oral Q6H PRN Arthor Captain, MD       magnesium hydroxide (MILK OF MAGNESIA) suspension 30 mL  30 mL Oral Daily PRN Margorie John W, PA-C       multivitamin with minerals tablet 1 tablet  1 tablet Oral Daily Margorie John W, PA-C   1 tablet at 08/06/21 0900   nicotine polacrilex (NICORETTE) gum 2 mg  2 mg Oral PRN Prescilla Sours, PA-C   2 mg at 08/06/21 1253   ondansetron (ZOFRAN-ODT)  disintegrating tablet 4 mg  4 mg Oral Q6H PRN Margorie John W, PA-C       potassium chloride SA (KLOR-CON) CR tablet 20 mEq  20 mEq Oral BID Arthor Captain, MD       [START ON 08/07/2021] thiamine tablet 100 mg  100 mg Oral Daily Arthor Captain, MD       traZODone (DESYREL) tablet 50 mg  50 mg Oral QHS PRN Prescilla Sours, PA-C       PTA Medications: Medications Prior to Admission  Medication Sig Dispense Refill Last Dose   ARIPiprazole (ABILIFY) 5 MG tablet Take 2.5 mg by mouth daily.   08/04/2021   LORazepam (ATIVAN) 2 MG tablet Take 1-2 mg by mouth every 8 (eight) hours as needed.   08/05/2021 at 1800   diphenhydrAMINE (BENADRYL) 25 mg capsule Take 50 mg by mouth at bedtime as needed. For sleep (Patient not taking: Reported on 08/05/2021)   Not Taking   escitalopram (LEXAPRO) 10 MG tablet Take 10 mg by mouth daily. (Patient not taking: Reported on 08/05/2021)   Not Taking    Patient Stressors: Health problems   Marital or family conflict   Substance abuse  Patient Strengths: Average or above average intelligence  Capable of independent living  Communication skills  Motivation for treatment/growth   Treatment Modalities: Medication Management, Group therapy, Case management,  1 to 1 session with clinician, Psychoeducation, Recreational therapy.   Physician Treatment Plan for Primary Diagnosis: MDD (major depressive disorder), recurrent severe, without psychosis ([place]) Long Term Goal(s): Improvement in symptoms so as ready for discharge   Short Term Goals: Ability to identify changes in lifestyle to reduce recurrence of condition will improve Ability to verbalize feelings will improve Ability to disclose and discuss suicidal ideas Ability to demonstrate self-control will improve Ability to identify and develop effective coping behaviors will improve Compliance with prescribed medications will improve Ability to identify triggers associated with substance abuse/mental health issues  will improve  Medication Management: Evaluate patient's response, side effects, and tolerance of medication regimen.  Therapeutic Interventions: 1 to 1 sessions, Unit Group sessions and Medication administration.  Evaluation of Outcomes: Not Met  Physician Treatment Plan for Secondary Diagnosis: Principal Problem:   MDD (major depressive disorder), recurrent severe, without psychosis ([place]) Active Problems:   GAD (generalized anxiety disorder)   Benzodiazepine dependence ([place])  Long Term Goal(s): Improvement in symptoms so as ready for discharge   Short Term Goals: Ability to identify changes in lifestyle to reduce recurrence of condition will improve Ability to verbalize feelings will improve Ability to disclose and discuss suicidal ideas Ability to demonstrate self-control will improve Ability to identify and develop effective coping behaviors will improve Compliance with prescribed medications will improve Ability to identify triggers associated with substance abuse/mental health issues will improve     Medication Management: Evaluate patient's response, side effects, and tolerance of medication regimen.  Therapeutic Interventions: 1 to 1 sessions, Unit Group sessions and Medication administration.  Evaluation of Outcomes: Not Met   RN Treatment Plan for Primary Diagnosis: MDD (major depressive disorder), recurrent severe, without psychosis ([place]) Long Term Goal(s): Knowledge of disease and therapeutic regimen to maintain health will improve  Short Term Goals: Ability to remain free from injury will improve, Ability to participate in decision making will improve, Ability to verbalize feelings will improve, Ability to disclose and discuss suicidal ideas, and Ability to identify and develop effective coping behaviors will improve  Medication Management: RN will administer medications as ordered by provider, will assess and evaluate patient's response and provide education to  patient for prescribed medication. RN will report any adverse and/or side effects to prescribing provider.  Therapeutic Interventions: 1 on 1 counseling sessions, Psychoeducation, Medication administration, Evaluate responses to treatment, Monitor vital signs and CBGs as ordered, Perform/monitor CIWA, COWS, AIMS and Fall Risk screenings as ordered, Perform wound care treatments as ordered.  Evaluation of Outcomes: Not Met   LCSW Treatment Plan for Primary Diagnosis: MDD (major depressive disorder), recurrent severe, without psychosis ([place]) Long Term Goal(s): Safe transition to appropriate next level of care at discharge, Engage patient in therapeutic group addressing interpersonal concerns.  Short Term Goals: Engage patient in aftercare planning with referrals and resources, Increase social support, Increase emotional regulation, Facilitate acceptance of mental health diagnosis and concerns, Identify triggers associated with mental health/substance abuse issues, and Increase skills for wellness and recovery  Therapeutic Interventions: Assess for all discharge needs, 1 to 1 time with Social worker, Explore available resources and support systems, Assess for adequacy in community support network, Educate family and significant other(s) on suicide prevention, Complete Psychosocial Assessment, Interpersonal group therapy.  Evaluation of Outcomes: Not Met   Progress in Treatment: Attending  groups: Yes. Participating in groups: Yes. Taking medication as prescribed: Yes. Toleration medication: Yes. Family/Significant other contact made: Yes, individual(s) contacted:  Mother  Patient understands diagnosis: Yes. Discussing patient identified problems/goals with staff: Yes. Medical problems stabilized or resolved: Yes. Denies suicidal/homicidal ideation: Yes. Issues/concerns per patient self-inventory: No.   New problem(s) identified: No, Describe:  None   New Short Term/Long Term Goal(s):  medication stabilization, elimination of SI thoughts, development of comprehensive mental wellness plan.   Patient Goals: Did not attend   Discharge Plan or Barriers: Patient recently admitted. CSW will continue to follow and assess for appropriate referrals and possible discharge planning.   Reason for Continuation of Hospitalization: Anxiety Depression Medication stabilization Suicidal ideation  Estimated Length of Stay: 3 to 5 days    Scribe for Treatment Team: Carney Harder 08/06/2021 2:44 PM

## 2021-08-06 NOTE — Group Note (Deleted)
LCSW Group Therapy Note   Group Date: 08/06/2021 Start Time: 1300 End Time: 1330   Type of Therapy and Topic:  Group Therapy:   Participation Level:  {[facility] PARTICIPATION QASUO:15615}  Description of Group:   Therapeutic Goals:  1.     Summary of Patient Progress:    ***  Therapeutic Modalities:   Felizardo Hoffmann, Theresia Majors 08/06/2021  2:00 PM

## 2021-08-06 NOTE — [facility] Group Notes (Signed)
[facility] Group Notes:  (Nursing/MHT/Case Management/Adjunct)  Date:  08/06/2021  Time:  8:18 PM  Type of Therapy:  Group Therapy  Participation Level:  Did Not Attend  Participation Quality:   na  Affect:   na  Cognitive:   na  Insight:  None  Engagement in Group:   na  Modes of Intervention:   na  Summary of Progress/Problems:  Lorita Officer 08/06/2021, 8:18 PM

## 2021-08-06 NOTE — Tx Team (Signed)
Initial Treatment Plan 08/06/2021 3:20 AM Brian Thomas [id number]    PATIENT STRESSORS: Health problems   Marital or family conflict   Substance abuse     PATIENT STRENGTHS: Average or above average intelligence  Capable of independent living  Communication skills  Motivation for treatment/growth    PATIENT IDENTIFIED PROBLEMS: Anxiety   SI without a plan  Benzodiazepine dependence  ("Wants to get off Ativan and work on finding new quality of life")               DISCHARGE CRITERIA:  Improved stabilization in mood, thinking, and/or behavior Motivation to continue treatment in a less acute level of care Verbal commitment to aftercare and medication compliance  PRELIMINARY DISCHARGE PLAN: Outpatient therapy Return to previous living arrangement Return to previous work or school arrangements  PATIENT/FAMILY INVOLVEMENT: This treatment plan has been presented to and reviewed with the patient, Brian Thomas, and/or family member.  The patient and family have been given the opportunity to ask questions and make suggestions.  Victorino December, RN 08/06/2021, 3:20 AM

## 2021-08-06 NOTE — H&P (Signed)
Psychiatric Admission Assessment Adult  Patient Identification: Brian Thomas MRN:  [id number] Date of Evaluation:  08/06/2021 Chief Complaint:  MDD (major depressive disorder), recurrent severe, without psychosis (HCC) [F33.2] Principal Diagnosis: MDD (major depressive disorder), recurrent severe, without psychosis (HCC) Diagnosis:  Principal Problem:   MDD (major depressive disorder), recurrent severe, without psychosis (HCC) Active Problems:   GAD (generalized anxiety disorder)   Benzodiazepine dependence (HCC) MDD (major depressive disorder), recurrent severe, without psychosis (HCC)  History of Present Illness: Brian Thomas is a 40 year old male with reported past psychiatric history of depression, anxiety, and OCD.  Per chart review and per patient, this is patient's second admission to the [facility] behavioral health Hospital (patient's last [facility] admission/inpatient psychiatric stay was in 2007).  Patient presented as a voluntary walk-in to [facility] requesting assistance for worsening depression, worsening anxiety, and discontinuing use of Ativan prescription.  Patient was evaluated by myself and TTS at that time and inpatient psychiatric treatment was recommended for the patient.  Per HPI from my earlier/recent 08/05/21 evaluation of the patient upon patient's [facility] walk-in evaluation (as documented in my 08/05/21 [facility] MSE note):   "[name] is a 40 y.o. male with reported past psychiatric history of depression, anxiety, and OCD, who presents to the [facility]) accompanied by his brother  Brian Thomas: [phone]) and nephew as a voluntary walk-in for worsening depression, worsening SI, worsening anxiety, and "needing help getting off Ativan".  Per patient's request, patient unaccompanied during the evaluation.  Patient states that he came to the behavioral health Hospital because he has been having "super bad anxiety, OCD, and depression" over the past few weeks.  Patient  reports that he has a history of depression, OCD, and anxiety but he states that these mental health symptoms have severely worsened over the past month and that he has "hit a wall" mentally.  Patient reports that he has been prescribed Ativan for anxiety for the past 2 years by his primary care provider.  Per chart review, it appears that patient has been prescribed Ativan 2 mg tablet with instructions to take 1 to 2 mg (0.5 - 1 tablet) p.o. every 8 hours as needed for anxiety.  Per PDMP review, there is documentation for patient being prescribed Ativan 1 mg tablets in 07/2020 and then Ativan 2 mg tablets from 07/2020 up until 07/2021, with most recent prescription being for 30-day supply of 90 Ativan 2 mg tablets, which was filled on 07/27/2021.  Per PDMP review, patient's Ativan prescriptions were prescribed by 1 provider from September 2021 until April 2022 and then patient's Ativan prescriptions have been prescribed by a different provider from May 2022 up until currently (September 2022).  Patient states that although his Ativan prescription is for 1-2 mg PO q8H PRN, he has been taking Ativan 2 mg p.o. every 8 hours scheduled (6 mg every 24 hrs/daily) for the past 2 years due to his anxiety as well as due to his reported dependence on this medication.  Patient denies diversion or abuse of his Ativan, but patient does state that he is physically dependent on Ativan.     Patient also states that he believes that the Ativan is "eating at his body", has not been helping with his depression or anxiety and has been making him feel worse mentally as well.  When patient is asked to provide further details regarding this, patient states that his energy is completely depleted, he is "a shell of a person", "has no sex drive",  and has no appetite and he believes that his continued Ativan use has some contributory factor to the symptoms he is experiencing. When patient is asked how long he has been experiencing these symptoms  mentioned above, patient states "too long" and does not provide further details.  Patient states in addition to the Ativan issues, his additional stressors that are contributing to his depression and anxiety include the following: Patient reports that his father died about 1 year ago and he has been struggling with grieving the loss of his father, patient reports that he has been divorced for the past few years and continues to struggle with dealing with the divorce, patient states that his current job is overworking him and has no backup support for him which makes it impossible for him to take any time off from work, patient reports that he has an 25-year-old daughter that stays with him on Fridays, Saturdays, and Sundays and he states that due to his depression and anxiety, he has not been able to be as present for his daughter or spend as much time with his daughter as he would like to during their time together.  Patient states that he tried to stop using Ativan completely 2 days ago on 08/03/21, but patient states that he only made about 24 hours without any Ativan before he had to resume using his Ativan due to his physical dependence/withdrawal symptoms.  Patient endorses history of the following withdrawal symptoms from Ativan: "Jitteriness", tremor, nervousness, and nausea.  He denies history of any additional benzodiazepine withdrawal symptoms.  However, when patient is asked about history of additional benzodiazepine withdrawal symptoms, he states that he does not have history of any additional withdrawal symptoms but does state that he has not gone long enough without Ativan/benzodiazepines in the past in order to determine if he has additional withdrawal symptoms or not from benzodiazepines.  Patient states that the longest period of time he has gone without taking any Ativan was 24 hours, which occurred on 08/03/21 (see details above).  He denies history of seizures.  He endorses some tremor and  nervousness currently, but he denies any chest pain, shortness of breath, headache, lightheadedness, dizziness, palpitations, abdominal pain, nausea, vomiting, or any additional physical symptoms on exam at this time.  Patient reports that his last Ativan use was 2 mg at 6:00 PM earlier this evening on 08/05/2021 and he states that thus far, he has taken a total daily dose of 6 mg for today, 08/05/2021.  Patient endorses passive SI "every day all day" for the past 20 years and endorses passive SI with no intent or plan currently on exam, stating that "this is not a life worth living".  He states that his SI has gotten worse recently due to his issues with Ativan use and the additional stressors mentioned above.  When patient is asked about any suicidal plans, patient states "I think I know what I would do", but patient does not provide further details and denies any specific suicidal plans or intent.  He endorses 1 past suicide attempt in 2007 in which she states that he overdosed on Xanax at that time.  Patient denies history of any additional suicide attempts.  He endorses history of self-injurious behavior via intentionally burning himself, but he states that he has not intentionally burned himself since 2007/2008.  He denies history of self-injurious behavior via cutting.  He denies HI.  He denies AVH, but does endorse "intrusive thoughts" but does not expound upon  this further.  Patient endorses chronic feelings of paranoia, stating that whenever he has a conversation with someone, he is constantly thinking "what are these people trying to get out of me?".  Regarding patient's paranoia, patient also states that he is constantly paranoid that he is "going to get pushed out" and fired from his job.  Patient reports experiencing daily panic attacks that last 10 to 15 minutes.  He states that his last panic attack was a few days ago.  Patient endorses feelings of tachycardia, palpitations, dizziness, and  hyperventilation associated with these panic attacks, but patient denies any of these symptoms currently on exam at this time.  Patient reports poor sleep, about 4 to 5 hours per night and patient reports that when he takes Ativan at night, his sleep increases from 6 to 7 hours per night.  Patient endorses intermittently waking up throughout the night.  He also reports having "nonsensical nightmares" often at night and he states that his nightmares are not attributed to any actual life experiences or past trauma.  He endorses anhedonia as well as worsening feelings of guilt, hopelessness, and worthlessness.  He endorses extreme fatigue, poor/worsening concentration, decreased appetite, and 15 pound weight loss over the past year.  Patient states that his appetite is so decreased to the point that he has to force himself to try to eat. Patient reports that he he has OCD.  However, upon questioning patient about details regarding OCD, patient denies engaging in repetitive/compulsive behaviors.  In addition to taking Ativan 2 mg p.o. every 8 hours (see details above), patient states that he is also prescribed Abilify 5 mg p.o. daily by his PCP, although patient states that he has actually only been taking Abilify 2.5 mg p.o. daily (patient states that he has been taking half of 1 5 mg tablet daily).  Patient states that the Abilify has been making him feel worse mentally and he states that he does not think the Abilify is helping.  He states that he no longer wants to take the Abilify.  Patient reports that he was previously prescribed Lexapro 10 mg p.o. daily by his PCP, but patient states that he only took the medication twice, did not like the way the medication made him feel, and he states that he discontinued the medication.  He reports that he has not taken Lexapro for a few months now. Patient states that he is not prescribed any additional psychotropic medications or any additional medications at this time.   He reports that he is not taking any additional home medications at this time.  Patient states that he took Risperdal in the past "a long time ago", the patient denies history of taking any additional psychotropic medications in the past.  Patient states that he does not have an outpatient psychiatrist or therapist at this time and he reports that he has never seen an outpatient psychiatrist or therapist.  Per chart review, patient was admitted to [facility] from 07/23/2006 to 07/25/2006.  Patient denies history of any additional inpatient psychiatric hospitalizations.  Patient lives alone in [place].  His daughter stays with him on Friday, Saturday, and Sunday during the week.  Patient states that he does not own any firearms and he denies any firearms in his home.  Patient does state that he does have access to his father's gun collection, in which patient states that there are multiple guns he has access to in his father's home which is 15 minutes  away from his home.  Patient does endorse fleeting thoughts of wanting to use 1 of these firearms on himself and he states that he last had these thoughts earlier today on 08/05/2021.  He denies ever having any thoughts of wanting to use firearms on anyone else.  Patient is currently employed as a Production designer, theatre/television/film at [employer].  He denies alcohol use.  He endorses smoking greater than 1 pack/day of cigarettes for the past few years and he endorses smoking cigarettes for 20 years.  He reports that he smoked an unknown amount of delta 8 last Friday, 08/01/2021.  He endorses smoking an unknown amount of delta 8 a few days per week for the past few months.  He denies history of any additional substance use.  Patient states that he does not have any sources of support at this time.   On exam, patient is sitting in a chair, appearing restless and tearful at times throughout the evaluation, but in no acute distress.  Patient's mood is anxious and  depressed with congruent affect.  Speech is clear and coherent with normal rate.  Speech volume is decreased.  Patient is alert and oriented x4, cooperative, and answers all questions appropriately.  No indication that patient is responding to internal or external stimuli on exam."  Associated Signs/Symptoms: Depression Symptoms:  depressed mood, anhedonia, insomnia, fatigue, feelings of worthlessness/guilt, difficulty concentrating, hopelessness, suicidal thoughts without plan, suicidal attempt, anxiety, panic attacks, loss of energy/fatigue, disturbed sleep, Duration of Depression Symptoms: Greater than two weeks  (Hypo) Manic Symptoms:   None. Anxiety Symptoms:  Excessive Worry, Panic Symptoms, Psychotic Symptoms:  Paranoia, PTSD Symptoms: Negative Total Time spent with patient: 1 hour  Past Psychiatric History:  One previous inpatient psychiatric hospitalization at [facility] behavioral health [facility] from 07/23/2006 to 07/25/2006 Ativan 2 mg (1-2 mg PO Q8H PRN for anxiety) prescribed by PCP. In addition to taking Ativan, patient states that he is also prescribed Abilify 5 mg p.o. daily by his PCP, although patient states that he has actually only been taking Abilify 2.5 mg p.o. daily (patient states that he has been taking half of 1 5 mg tablet daily).  Patient states that the Abilify has been making him feel worse mentally and he states that he does not think the Abilify is helping.  He states that he no longer wants to take the Abilify.  Patient reports that he was previously prescribed Lexapro 10 mg p.o. daily by his PCP, but patient states that he only took the medication twice, did not like the way the medication made him feel, and he states that he discontinued the medication.  He reports that he has not taken Lexapro for a few months now. Patient states that he is not prescribed any additional psychotropic medications or any additional medications at this time.  He reports that he is  not taking any additional home medications at this time.  Patient states that he took Risperdal in the past "a long time ago", the patient denies history of taking any additional psychotropic medications in the past.  Patient states that he does not have an outpatient psychiatrist or therapist at this time and he reports that he has never seen an outpatient psychiatrist or therapist.   Is the patient at risk to self? Yes.    Has the patient been a risk to self in the past 6 months? No.  Has the patient been a risk to self within the distant past? Yes.  Is the patient a risk to others? No.  Has the patient been a risk to others in the past 6 months? No.  Has the patient been a risk to others within the distant past? No.   Prior Inpatient Therapy:  One previous inpatient psychiatric hospitalization at [facility] from 07/23/2006 to 07/25/2006 Prior Outpatient Therapy: Patient receives psychotropic medication prescriptions from PCP (see HPI for details), but reports he has never received outpatient psychiatry or therapy services.   Alcohol Screening: 1. How often do you have a drink containing alcohol?: Never 2. How many drinks containing alcohol do you have on a typical day when you are drinking?: 1 or 2 3. How often do you have six or more drinks on one occasion?: Never AUDIT-C Score: 0 9. Have you or someone else been injured as a result of your drinking?: No 10. Has a relative or friend or a doctor or another health worker been concerned about your drinking or suggested you cut down?: No Alcohol Use Disorder Identification Test Final Score (AUDIT): 0 Substance Abuse History in the last 12 months:  Yes.   Consequences of Substance Abuse: Negative Previous Psychotropic Medications: Yes  Psychological Evaluations: Yes  Past Medical History:  Past Medical History:  Diagnosis Date   Pneumonia    History reviewed. No pertinent surgical history. Family History: History  reviewed. No pertinent family history. Family Psychiatric  History: None reported.  Tobacco Screening:   Social History:  Social History   Substance and Sexual Activity  Alcohol Use No     Social History   Substance and Sexual Activity  Drug Use Yes   Comment: takes Delta 8 last use Friday 08/01/21    Additional Social History: Marital status: Divorced Does patient have children?: Yes How many children?: 1    Pain Medications: None Prescriptions: Ativan, Abilify History of alcohol / drug use?: No history of alcohol / drug abuse Withdrawal Symptoms: Tremors   Allergies:  No Known Allergies Lab Results:  Results for orders placed or performed during the hospital encounter of 08/05/21 (from the past 48 hour(s))  Resp panel by RT-PCR (RSV, Flu A&B, Covid)     Status: None   Collection Time: 08/05/21 11:23 PM  Result Value Ref Range   SARS Coronavirus 2 by RT PCR NEGATIVE NEGATIVE    Comment: (NOTE) SARS-CoV-2 target nucleic acids are NOT DETECTED.  The SARS-CoV-2 RNA is generally detectable in upper respiratory specimens during the acute phase of infection. The lowest concentration of SARS-CoV-2 viral copies this assay can detect is 138 copies/mL. A negative result does not preclude SARS-Cov-2 infection and should not be used as the sole basis for treatment or other patient management decisions. A negative result may occur with  improper specimen collection/handling, submission of specimen other than nasopharyngeal swab, presence of viral mutation(s) within the areas targeted by this assay, and inadequate number of viral copies(<138 copies/mL). A negative result must be combined with clinical observations, patient history, and epidemiological information. The expected result is Negative.  Fact Sheet for Patients:  BloggerCourse.com  Fact Sheet for Healthcare Providers:  SeriousBroker.it  This test is no t yet approved  or cleared by the [place] FDA and  has been authorized for detection and/or diagnosis of SARS-CoV-2 by FDA under an Emergency Use Authorization (EUA). This EUA will remain  in effect (meaning this test can be used) for the duration of the COVID-19 declaration under Section 564(b)(1) of the Act, 21 U.S.C.section 360bbb-3(b)(1), unless the  authorization is terminated  or revoked sooner.       Influenza A by PCR NEGATIVE NEGATIVE   Influenza B by PCR NEGATIVE NEGATIVE    Comment: (NOTE) The Xpert Xpress SARS-CoV-2/FLU/RSV plus assay is intended as an aid in the diagnosis of influenza from Nasopharyngeal swab specimens and should not be used as a sole basis for treatment. Nasal washings and aspirates are unacceptable for Xpert Xpress SARS-CoV-2/FLU/RSV testing.  Fact Sheet for Patients: BloggerCourse.com  Fact Sheet for Healthcare Providers: SeriousBroker.it  This test is not yet approved or cleared by the [place] FDA and has been authorized for detection and/or diagnosis of SARS-CoV-2 by FDA under an Emergency Use Authorization (EUA). This EUA will remain in effect (meaning this test can be used) for the duration of the COVID-19 declaration under Section 564(b)(1) of the Act, 21 U.S.C. section 360bbb-3(b)(1), unless the authorization is terminated or revoked.     Resp Syncytial Virus by PCR NEGATIVE NEGATIVE    Comment: (NOTE) Fact Sheet for Patients: BloggerCourse.com  Fact Sheet for Healthcare Providers: SeriousBroker.it  This test is not yet approved or cleared by the [place] FDA and has been authorized for detection and/or diagnosis of SARS-CoV-2 by FDA under an Emergency Use Authorization (EUA). This EUA will remain in effect (meaning this test can be used) for the duration of the COVID-19 declaration under Section 564(b)(1) of the Act, 21 U.S.C. section  360bbb-3(b)(1), unless the authorization is terminated or revoked.  Performed at [facility], [address], [place], [place] [address]     Blood Alcohol level:  No results found for: [facility]  Metabolic Disorder Labs:  No results found for: HGBA1C, MPG No results found for: PROLACTIN No results found for: CHOL, TRIG, HDL, CHOLHDL, VLDL, LDLCALC  Current Medications: Current Facility-Administered Medications  Medication Dose Route Frequency Provider Last Rate Last Admin   acetaminophen (TYLENOL) tablet 650 mg  650 mg Oral Q6H PRN Jaclyn Shaggy, PA-C       alum & mag hydroxide-simeth (MAALOX/MYLANTA) 200-200-20 MG/5ML suspension 30 mL  30 mL Oral Q4H PRN Melbourne Abts W, PA-C       feeding supplement (ENSURE ENLIVE / ENSURE PLUS) liquid 237 mL  237 mL Oral BID BM Ladona Ridgel, Harlan Ervine W, PA-C       loperamide (IMODIUM) capsule 2-4 mg  2-4 mg Oral PRN Melbourne Abts W, PA-C       LORazepam (ATIVAN) tablet 1 mg  1 mg Oral Q6H PRN Melbourne Abts W, PA-C       LORazepam (ATIVAN) tablet 1 mg  1 mg Oral QID Melbourne Abts W, PA-C       Followed by   Melene Muller ON 08/07/2021] LORazepam (ATIVAN) tablet 1 mg  1 mg Oral TID Jaclyn Shaggy, PA-C       Followed by   Melene Muller ON 08/08/2021] LORazepam (ATIVAN) tablet 1 mg  1 mg Oral BID Melbourne Abts W, PA-C       Followed by   Melene Muller ON 08/09/2021] LORazepam (ATIVAN) tablet 1 mg  1 mg Oral Daily Ladona Ridgel, Mayleigh Tetrault W, PA-C       magnesium hydroxide (MILK OF MAGNESIA) suspension 30 mL  30 mL Oral Daily PRN Melbourne Abts W, PA-C       multivitamin with minerals tablet 1 tablet  1 tablet Oral Daily Ladona Ridgel, Palyn Scrima W, PA-C       nicotine polacrilex (NICORETTE) gum 2 mg  2 mg Oral PRN Jaclyn Shaggy, PA-C  2 mg at 08/06/21 0157   ondansetron (ZOFRAN-ODT) disintegrating tablet 4 mg  4 mg Oral Q6H PRN Jaclyn Shaggy, PA-C       [START ON 08/07/2021] thiamine tablet 100 mg  100 mg Oral Daily Melbourne Abts W, PA-C       traZODone (DESYREL) tablet 50 mg  50 mg Oral QHS PRN  Jaclyn Shaggy, PA-C       PTA Medications: Medications Prior to Admission  Medication Sig Dispense Refill Last Dose   ARIPiprazole (ABILIFY) 5 MG tablet Take 2.5 mg by mouth daily.   08/04/2021   LORazepam (ATIVAN) 2 MG tablet Take 1-2 mg by mouth every 8 (eight) hours as needed.   08/05/2021 at 1800   diphenhydrAMINE (BENADRYL) 25 mg capsule Take 50 mg by mouth at bedtime as needed. For sleep (Patient not taking: Reported on 08/05/2021)   Not Taking   escitalopram (LEXAPRO) 10 MG tablet Take 10 mg by mouth daily. (Patient not taking: Reported on 08/05/2021)   Not Taking    Musculoskeletal: Strength & Muscle Tone: within normal limits Gait & Station: normal Patient leans: N/A   Psychiatric Specialty Exam:  Presentation  General Appearance: Disheveled  Eye Contact:Good  Speech:Clear and Coherent; Normal Rate  Speech Volume:Decreased  Handedness: No data recorded  Mood and Affect  Mood:Anxious; Depressed  Affect:Congruent; Tearful   Thought Process  Thought Processes:Coherent; Goal Directed; Linear  Duration of Psychotic Symptoms: N/A  Past Diagnosis of Schizophrenia or Psychoactive disorder: No  Descriptions of Associations:Intact  Orientation:Full (Time, Place and Person)  Thought Content:Logical; WDL; Paranoid Ideation  Hallucinations:Hallucinations: None  Ideas of Reference:Paranoia  Suicidal Thoughts:Suicidal Thoughts: Yes, Passive SI Passive Intent and/or Plan: Without Intent; Without Plan; Without Means to Carry Out; With Access to Means  Homicidal Thoughts:Homicidal Thoughts: No   Sensorium  Memory:Immediate Good; Recent Good; Remote Good  Judgment:Fair  Insight:Fair   Executive Functions  Concentration:Fair  Attention Span:Fair  Recall:Good  Fund of Knowledge:Good  Language:Good   Psychomotor Activity  Psychomotor Activity:Psychomotor Activity: Restlessness   Assets  Assets:Communication Skills; Desire for Improvement; Financial  Resources/Insurance; Housing; Leisure Time; Physical Health; Resilience; Social Support; Vocational/Educational   Sleep  Sleep:Sleep: Poor Number of Hours of Sleep: 4      Physical Exam: Physical Exam Vitals reviewed.  Constitutional:      General: He is not in acute distress.    Appearance: He is not ill-appearing, toxic-appearing or diaphoretic.  HENT:     Head: Normocephalic and atraumatic.     Right Ear: External ear normal.     Left Ear: External ear normal.     Nose: Nose normal.  Eyes:     Conjunctiva/sclera: Conjunctivae normal.  Pulmonary:     Effort: Pulmonary effort is normal. No respiratory distress.  Musculoskeletal:        General: Normal range of motion.     Cervical back: Normal range of motion.  Neurological:     General: No focal deficit present.     Mental Status: He is alert and oriented to person, place, and time.     Comments: Moderate tremor noted of bilateral hands upon bilateral arm extension.   Psychiatric:        Attention and Perception: He does not perceive auditory or visual hallucinations.        Mood and Affect: Mood is anxious and depressed.        Speech: Speech normal.        Behavior: Behavior is withdrawn. Behavior  is not agitated, slowed, aggressive or combative. Behavior is cooperative.        Thought Content: Thought content is paranoid. Thought content is not delusional. Thought content does not include homicidal or suicidal ideation.     Comments: Affect mood congruent and tearful.     Review of Systems  Constitutional:  Positive for malaise/fatigue and weight loss. Negative for chills, diaphoresis and fever.  HENT:  Negative for congestion.   Respiratory:  Negative for cough and shortness of breath.   Cardiovascular:  Positive for palpitations. Negative for chest pain.  Gastrointestinal:  Positive for nausea. Negative for abdominal pain, constipation, diarrhea and vomiting.  Musculoskeletal:  Negative for joint pain and  myalgias.  Neurological:  Positive for tremors. Negative for dizziness, seizures and headaches.  Psychiatric/Behavioral:  Positive for depression and suicidal ideas. Negative for hallucinations and memory loss. The patient is nervous/anxious and has insomnia.   All other systems reviewed and are negative. Patient endorses history of the following withdrawal symptoms from Ativan: "Jitteriness", tremor, nervousness, and nausea.  He denies history of any additional benzodiazepine withdrawal symptoms.  However, when patient is asked about history of additional benzodiazepine withdrawal symptoms, he states that he does not have history of any additional withdrawal symptoms but does state that he has not gone long enough without Ativan/benzodiazepines in the past in order to determine if he has additional withdrawal symptoms or not from benzodiazepines.  Patient states that the longest period of time he has gone without taking any Ativan was 24 hours, which occurred on 08/03/21 (see details above). He denies history of seizures.  He endorses some tremor and nervousness currently, but he denies any chest pain, shortness of breath, headache, lightheadedness, dizziness, palpitations, abdominal pain, nausea, vomiting, or any additional physical symptoms on exam at this time. Patient reports experiencing daily panic attacks that last 10 to 15 minutes.  He states that his last panic attack was a few days ago.  Patient endorses feelings of tachycardia, palpitations, dizziness, and hyperventilation associated with these panic attacks, but patient denies any of these symptoms currently on exam at this time.   Vitals: Blood pressure 117/88, pulse 83, temperature 98.2 F (36.8 C), temperature source Oral, resp. rate 18, height 5\' 10"  (1.778 m), weight 61.2 kg, SpO2 100 %. Body mass index is 19.37 kg/m.  Treatment Plan Summary: Admit patient to [facility] 300 hall for further crisis stabilization. Patient will be integrated into  the milieu and well participate in medication management and group therapy. Daily contact with patient to assess and evaluate symptoms and progress in treatment and Medication management and group therapy   Current medications ordered:  -Tylenol 650 mg p.o. every 6 hours as needed for mild pain  -Maalox/Mylanta 30 mL p.o. every 4 hours as needed for indigestion  -Ensure Enlive 237 mL p.o. 2 times daily between meals for nutritional supplementation  -Imodium capsule 2 to 4 mg p.o. as needed for diarrhea or loose stools  -Due to patient's benzodiazepine dependence and patient wanting to stop using Ativan, discussed initiating Ativan taper with the patient in order to assess patient with tapering off of Ativan use.  After discussion with patient, patient agrees to initiate Ativan taper.  Will initiate the following Ativan taper:   -Ativan 1 mg p.o. 4 times daily for 4 doses (starting on 08/06/21) followed by Ativan 1 mg p.o. 3 times daily for 3 doses (starting on 08/07/21) followed by Ativan 1 mg p.o. 2 times daily for 2 doses (starting  on 08/08/21)  followed by Ativan 1 mg p.o. daily for 1 dose (on 08/09/21)  -Will also order Ativan 1 mg p.o. every 6 hours as needed for anxiety  -Milk of Magnesia 30 mL p.o. daily as needed for mild constipation  -Multivitamin with minerals: 1 tablet p.o. daily for nutritional supplementation  -Thiamine tablet 100 mg p.o. daily for nutritional supplementation  -Nicorette gum 2 mg p.o. as needed for smoking cessation/nicotine cravings  -Zofran ODT 4 mg p.o. every 6 hours as needed for nausea/vomiting  -Trazodone 50 mg p.o. at bedtime as needed for sleep   -Patient educated on side effect profile of trazodone.  -Patient is currently taking Abilify 2.5 mg p.o. daily, but patient states that he does not think that Ativan is helping with his mental health symptoms and he reports that he does not want to take the Abilify anymore. Thus, I will hold off on ordering patient's home  medication of Abilify at this time and will defer to dayshift treatment team to discuss patient's Abilify/other psychotropic medication options with the patient further.   Observation Level/Precautions:  15 minute checks  Laboratory:   Labs/tests ordered: -CBC: To be collected on 08/06/21 at [facility], no results at this time. -CMP: To be collected on 08/06/21 at [facility], no results at this time. -Hemoglobin A1C: To be collected on 08/06/21 at [facility], no results at this time. -Lipid Panel: To be collected on 08/06/21 at [facility], no results at this time. -TSH: To be collected on 08/06/21 at [facility], no results at this time. UDS: To be collected on 08/06/21 at [facility], no results at this time. -STI Screening Labs:     -Urine GC/Chlamydia: To be collected on 08/06/21 at [facility], no results at this time.    -Hepatitis panel: To be collected on 08/06/21 at [facility], no results at this time.    -HIV Antibody (routine testing w rflx) To be collected on 08/06/21 at [facility], no results at this time.    -RPR: To be collected on 08/06/21 at [facility], no results at this time. -EKG ordered to check patient's baseline QTC due to patient's history of taking antipsychotic medication (Abilify). To be conducted on 08/06/21 at [facility], no results at this time.  Psychotherapy: Group Therapy   Medications:  See Treatment Plan Summary Above/MAR  Consultations:  Inpatient consult placed to spiritual care due to patient wanting to discuss Advance Directives during his [facility] stay.   Discharge Concerns:  SI, Patient's ability to contract for safety, Access to means (firearms/weapons), access to medications, medication compliance  Estimated LOS: 3-5 days (Patient's LOS may change in the future based on patient's progress with treatment during his inpatient stay).   Other:     Physician Treatment Plan for Primary Diagnosis: MDD (major depressive disorder), recurrent severe, without psychosis (HCC) Long Term Goal(s): Improvement in symptoms so as ready for discharge  Short Term  Goals: Ability to identify changes in lifestyle to reduce recurrence of condition will improve, Ability to verbalize feelings will improve, Ability to disclose and discuss suicidal ideas, Ability to demonstrate self-control will improve, Ability to identify and develop effective coping behaviors will improve, Compliance with prescribed medications will improve, and Ability to identify triggers associated with substance abuse/mental health issues will improve  Physician Treatment Plan for Secondary Diagnosis: Principal Problem:   MDD (major depressive disorder), recurrent severe, without psychosis (HCC) Active Problems:   GAD (generalized anxiety disorder)   Benzodiazepine dependence (HCC)  Long Term Goal(s): Improvement in symptoms so as ready  for discharge  Short Term Goals: Ability to identify changes in lifestyle to reduce recurrence of condition will improve, Ability to verbalize feelings will improve, Ability to disclose and discuss suicidal ideas, Ability to demonstrate self-control will improve, Ability to identify and develop effective coping behaviors will improve, Compliance with prescribed medications will improve, and Ability to identify triggers associated with substance abuse/mental health issues will improve  I certify that inpatient services furnished can reasonably be expected to improve the patient's condition.    Please see Psychiatrist's Suicide Risk Assessment (SRA) for further details regarding patient's treatment plan.   Jaclyn Shaggy, PA-C 9/14/20222:49 AM

## 2021-08-06 NOTE — [facility] Suicide Risk Assessment (Signed)
[facility] Admission Suicide Risk Assessment   Nursing information obtained from:  Patient Demographic factors:  Male, Caucasian, Low socioeconomic status, Living alone Current Mental Status:  NA Loss Factors:  Loss of significant relationship, Decline in physical health Historical Factors:  Prior suicide attempts Risk Reduction Factors:  Employed, Responsible for children under 38 years of age, Sense of responsibility to family  Total Time spent with patient:  45 minutes Principal Problem: MDD (major depressive disorder), recurrent severe, without psychosis ([place]) Diagnosis:  Principal Problem:   MDD (major depressive disorder), recurrent severe, without psychosis ([place]) Active Problems:   GAD (generalized anxiety disorder)   Benzodiazepine dependence ([place])  Subjective Data: Medical record reviewed.  Patient's case discussed in detail with members of the treatment team and nursing staff.  I met with and evaluated the patient on the unit today.  Brian Thomas is a 40 year old male with past psychiatric history of anxiety and depression who presented as a walk-in to [facility] reporting worsening depression, worsening anxiety, increased suicidal ideation and requesting "help getting off Ativan."  Patient reported that his anxiety and depression had worsened over the last month.  He has been taking prescribed Ativan for anxiety 2 mg 3 times a day PRN for 2 years and states he has consistently taken 6 mg every 24 hours during that time.  Patient reports that Ativan is no longer helpful and he believes he feels worse mentally on it.  Patient is trying to cope with recent stressors of the death of his father about 1 year ago and a divorce several years ago as well as significant work stress with limited time off.  Patient tried to completely discontinue Ativan 2 days prior to presentation to [facility] but experienced jitteriness, tremor, nervousness and nausea.  His last use of Ativan prior to presentation to [facility] was at 6 PM  on 08/05/2021.  Patient was started on standing dose lorazepam taper with Ativan detox protocol and PRN lorazepam to cover for possible breakthrough withdrawal symptoms.  Patient slept poorly last night due to his late admission time to the unit.  He had to be encouraged to take his first dose of lorazepam today in the hospital and did so after receiving an explanation of the potential danger of stopping Ativan abruptly.  During my evaluation with patient, he is pleasant and cooperative with anxious depressed mood and constricted affect.  There is no tremor or diaphoresis observed on exam but patient is restless and repeatedly bounces his legs throughout the interview.  Patient states that he does this when he is anxious.  Patient endorses recent symptoms of early and middle insomnia, poor appetite with weight loss, sad mood, feelings of worthlessness, problems concentrating, anhedonia, low energy, guilt, chronic passive suicidal ideation without intent or plan, increased anxiety, worry about multiple topics, intrusive obsessive thoughts about possible future catastrophic events and ruminative worry about past and future mistakes.  Patient states that he has had panic attacks in the past with extreme anxiety, trouble breathing and heart palpitations.  He reports that he was recently prescribed Lexapro but only took 2 doses of it because it made him feel jittery.  He also had been taking Abilify 2.5 mg daily for 6 months prior to presentation to [facility] but feels Abilify has not been helpful and may have made him feel worse.  This morning patient denies SI or thoughts of harming himself in the hospital.  He denies AI, HI, PI, AH or VH.  Today he denies tremor, confusion, nausea,  vomiting, diaphoresis, palpitations, racing heart or any symptoms of benzodiazepine withdrawal other than feeling anxious and jittery.  Patient reports use of delta 8 approximately once per week with most recent use on 08/01/2021.  He denies  other drug use.  He denies prescription medication misuse.  He denies alcohol use.  He smokes 1 pack of cigarettes per day.  Review of PDMP shows prescription for lorazepam 2 mg tablet #90 tablets for 30 days last filled on 07/27/2021.  Prior to that time patient also received refills for lorazepam of the same dose and quantity at approximately monthly intervals.  Patient reports prior psychiatric diagnoses of depression, anxiety, panic and OCD (he describes OCD symptoms as repeatedly asking the same question and seeking reassurance).  He reports 1 prior inpatient psychiatric hospitalization at [facility] in 2007 after he made a suicide attempt by taking an overdose of alprazolam pills.  Patient states that his only suicide attempt was the overdose precipitating his 2007 admission to the hospital.  Per chart review patient has a remote history of self-injurious behavior by intentionally burning himself but has not engaged in this behavior since approximately 2007 or 2008.  He reports past medication trials of Lexapro (made him jittery), risperidone (weight gain), Wellbutrin (did not work), Abilify (poor efficacy, possible increased anxiety), and Ativan.  Chart review also indicates patient may have been treated with Xanax in the past.  Patient states that he is currently treated by his PCP and does not have a therapist.  He does not believe he has taken Zoloft, Prozac, Paxil or other antidepressants (besides those listed above) for anxiety and depression in the past.  Patient denies any history of asthma or seizure.  He denies any history of medical problems.  Patient states he has no known allergies.  He states that he has not been taking any medications prior to admission other than Ativan and Abilify as described above.  Patient states that he has a brother who had problems with alcohol in the past but no longer drinks.  He reports a history of suicide in a maternal cousin.  He denies other known family  psychiatric history.  Continued Clinical Symptoms:  Alcohol Use Disorder Identification Test Final Score (AUDIT): 0 The "Alcohol Use Disorders Identification Test", Guidelines for Use in Primary Care, Second Edition.  World Pharmacologist [facility]). Score between 0-7:  no or low risk or alcohol related problems. Score between 8-15:  moderate risk of alcohol related problems. Score between 16-19:  high risk of alcohol related problems. Score 20 or above:  warrants further diagnostic evaluation for alcohol dependence and treatment.   CLINICAL FACTORS:   Severe Anxiety and/or Agitation Panic Attacks Depression:   Anhedonia Hopelessness Insomnia Alcohol/Substance Abuse/Dependencies Previous Psychiatric Diagnoses and Treatments   Musculoskeletal: Strength & Muscle Tone: within normal limits Gait & Station: normal Patient leans: N/A  Psychiatric Specialty Exam:  Presentation  General Appearance: Appropriate for Environment; Fairly Groomed  Eye Contact:Good  Speech:Clear and Coherent; Normal Rate  Speech Volume:Normal  Handedness: No data recorded  Mood and Affect  Mood:Anxious; Depressed  Affect:Congruent   Thought Process  Thought Processes:Coherent; Goal Directed  Descriptions of Associations:Intact  Orientation:Full (Time, Place and Person)  Thought Content:Logical; Rumination; Other (comment) (Ruminative worry about possible negative future events)  History of Schizophrenia/Schizoaffective disorder:No  Duration of Psychotic Symptoms:N/A  Hallucinations:Hallucinations: None  Ideas of Reference:None  Suicidal Thoughts:Suicidal Thoughts: No SI Passive Intent and/or Plan: Without Intent; Without Plan; Without Means to Carry Out; With Access to  Means  Homicidal Thoughts:Homicidal Thoughts: No   Sensorium  Memory:Immediate Good; Recent Good; Remote Good  Judgment:Fair  Insight:Fair   Executive Functions  Concentration:Good  Attention  Span:Good  Recall:Good  Fund of Knowledge:Good  Language:Good   Psychomotor Activity  Psychomotor Activity:Psychomotor Activity: Restlessness   Assets  Assets:Communication Skills; Desire for Improvement; Financial Resources/Insurance; Housing; Physical Health; Resilience; Social Support; Vocational/Educational   Sleep  Sleep:Sleep: Poor Number of Hours of Sleep: 1.5    Physical Exam: Physical Exam Vitals and nursing note reviewed.  Constitutional:      Appearance: Normal appearance.  HENT:     Head: Normocephalic and atraumatic.  Cardiovascular:     Rate and Rhythm: Tachycardia present.  Pulmonary:     Effort: Pulmonary effort is normal.  Neurological:     General: No focal deficit present.     Mental Status: He is alert and oriented to person, place, and time.   Review of Systems  Constitutional:  Positive for weight loss. Negative for chills, diaphoresis and fever.  HENT:  Negative for sore throat.   Respiratory:  Negative for cough and shortness of breath.   Cardiovascular:  Negative for chest pain and palpitations.  Gastrointestinal:  Negative for constipation, diarrhea, nausea and vomiting.  Musculoskeletal:  Negative for myalgias.  Neurological:  Negative for dizziness, tremors, seizures and headaches.  Psychiatric/Behavioral:  Positive for depression. Negative for hallucinations, substance abuse and suicidal ideas. The patient is nervous/anxious and has insomnia.   All other systems reviewed and are negative.  Vital signs on 08/06/2021 at 9:08 AM include: BP 108/73 sitting and 106/82 standing; pulse 91 sitting and 115 standing; respirations 20; O2 sat 100% on room air; and temperature 98.3. Blood pressure 106/82, pulse (!) 115, temperature 98.3 F (36.8 C), resp. rate 20, height $RemoveBe'5\' 10"'[id number]$  (1.778 m), weight 59.9 kg, SpO2 100 %. Body mass index is 18.94 kg/m.   COGNITIVE FEATURES THAT CONTRIBUTE TO RISK:  Thought constriction (tunnel vision)    SUICIDE  RISK:   Severe:  Frequent, intense, and enduring suicidal ideation, specific plan, no subjective intent, but some objective markers of intent (i.e., choice of lethal method), the method is accessible, some limited preparatory behavior, evidence of impaired self-control, severe dysphoria/symptomatology, multiple risk factors present, and few if any protective factors, particularly a lack of social support.  PLAN OF CARE: The patient is a 40 year old male with a history of depression, anxiety, and benzodiazepine dependence on prescribed lorazepam which he has taken for the past 2 years who was admitted with worsening symptoms of anxiety and depression, worsening suicidal thoughts and seeking help stopping Ativan.  Patient meets criteria for major depressive disorder, recurrent without psychotic features; generalized anxiety disorder with panic attacks and benzodiazepine dependence on prescribed benzodiazepines.  Cannot rule out possible OCD diagnosis but unable to obtain clear history today.  Patient denies taking higher than prescribed dose of lorazepam but does state that he has been consistently taking 2 mg 3 times a day for the past 2 years.  He has been admitted for treatment of depression and anxiety as well as detox from Ativan.  On exam today, patient appears anxious but does not appear to have any current signs of benzodiazepine withdrawal.  Patient is a bit orthostatic but his vital signs are stable and within normal limits.  Patient has been admitted to the 300 inpatient unit.  We will continue every 15-minute observation level.  Encourage participation in group therapy and therapeutic milieu.  Available lab results reviewed.  CMP showed potassium 3.4 and otherwise WNL.  Lipid profile showed HDL 39, LDL 116 otherwise WNL.  CBC was WNL.  Differential was not performed.  Hemoglobin A1c was 4.6.  TSH was 1.023.  RPR was nonreactive.  Influenza A, influenza B and coronavirus testing were negative.  HIV  screen was nonreactive.  BAL was not performed.  Urine tox screen was positive for benzodiazepines and THC.  Titus panel has been drawn and results are pending.  Urine testing for GC and chlamydia has been ordered but specimen has not yet been collected.  KG performed this morning showed normal sinus rhythm with sinus arrhythmia, ventricular rate 71 and QT/QTc of 380/412.  Patient has been started on standing dose lorazepam taper of 1 mg 4 times daily with plan to decrease by 1 mg each day.  Lorazepam 1 mg Q6H PRN for withdrawal symptoms has also been ordered.  Will start hydroxyzine 25 mg Q6H PRN anxiety and trazodone 50 mg nightly PRN insomnia.  I have discussed with patient the possibility of starting Zoloft or another SSRI to treat his depression and anxiety.  He stated that he will consider initiation of an SSRI but would like to defer initiation today until he feels a bit more comfortable physically.  Encourage p.o. fluid intake.  I advised patient of risks of delta 8 use including but not limited to the potential for increased anxiety and other psychiatric symptoms.  I advised cessation of use of delta 8 and advised patient to refrain from use of other drugs or alcohol.  Patient stated understanding of information discussed and plan to refrain from use.  Patient will need referral to outpatient therapy and psychiatric medication management at discharge.  Consider referral to substance abuse treatment program.  Estimated length of stay 5 to 7 days.  I certify that inpatient services furnished can reasonably be expected to improve the patient's condition.   Arthor Captain, MD 08/06/2021, 1:24 PM

## 2021-08-06 NOTE — Progress Notes (Addendum)
Patient ID: Brian Thomas, male   DOB: 08-14-1981, 40 y.o.   MRN: [id number]  D: Pt here voluntary as a walk in. Pt denies SI/HI/AVH and pain at this time. Pt has been taking Ativan 2 mg q8h for the past 2 years. Says it doesn't really help his anxiety but he is dependent on it now and wants to get off it. Pt denies SI currently but has had SI thoughts off and on for the past 20 years. Pt has been eating poorly because of a decreased appetite and has lost 15 pounds in the past year. Pt endorses decreased appetite, difficulty concentrating, anxiety, helplessness, hopelessness and poor quality of life. Pt has history of self harm by burning (last time in 2008) and previous suicide attempts. Pt endorses intrusive self-harm thoughts but has not acted on them. Pt endorses trouble sleeping and a general spiraling out of control due to job stress and a gradual decline in his health.   Pt has worked as a Production designer, theatre/television/film at [employer] since 2013 and does not like his job. He doesn't have leave to take time off. His father passed away last year and he had to continue working. Pt worried that he may get fired for seeking help and being admitted inpatient.   Pt has an 8 year old daughter that he sees every weekend. Pt is divorced and lives alone. Does not endorse any social support. "My mother is one of those who tells me to self-talk in the mirror and keep moving. She's like "The Secret" meets christianity. She is not one to hug me and tell me everything will be okay."   Pt current meds are Abilify and Ativan. Is not taking Benadryl or Lexapro. Pt takes Delta 8. Last use was Friday 07/31/21. Pt is an every day smoker (1 ppd for last 20 years) and denies alcohol use.   A: Pt was offered support and encouragement. Pt is cooperative during assessment. VS assessed and admission paperwork signed. Pt brought bottle labeled Lorazepam 2 mg to [facility]. Medication counted out in front of patient and recorded on medication sheet signed  by this Clinical research associate and patient. Belongings searched and contraband items placed in locker. Non-invasive skin search completed: multiple tattoos noted. Pt offered food and drink and both accepted. Pt introduced to unit milieu by nursing staff. Q 15 minute checks were started for safety.   R: Pt in room. Pt safety maintained on unit.

## 2021-08-06 NOTE — Group Note (Signed)
Recreation Therapy Group Note   Group Topic:Stress Management  Group Date: 08/06/2021 Start Time: 0930 End Time: 0950 Facilitators: Caroll Rancher, LRT/CTRS Location: 300 [address] Dayroom  Goal Area(s) Addresses:  Patient will actively participate in stress management techniques presented during session.  Patient will successfully identify benefit of practicing stress management post d/c.   Group Description:  Guided Imagery. LRT provided education, instruction, and demonstration on practice of visualization via guided imagery. Patient was asked to participate in the technique introduced during session in which patients would take a journey along the beach. LRT debriefed including topics of mindfulness, stress management and specific scenarios each patient could use these techniques. Patients were given suggestions of ways to access scripts post d/c and encouraged to explore Youtube and other apps available on smartphones, tablets, and computers.   Affect/Mood: N/A   Participation Level: Did not attend    Clinical Observations/Individualized Feedback: Pt did not attend group session.    Plan: Continue to engage patient in RT group sessions 2-3x/week.   Caroll Rancher, LRT/CTRS 08/06/2021 10:53 AM

## 2021-08-07 MED ORDER — CLONAZEPAM 1 MG PO TABS
1.0000 mg | ORAL_TABLET | Freq: Every evening | ORAL | Status: DC
  Administered 2021-08-07 – 2021-08-08 (×2): 1 mg via ORAL
  Filled 2021-08-07 (×2): qty 1

## 2021-08-07 MED ORDER — CLONAZEPAM 1 MG PO TABS
1.0000 mg | ORAL_TABLET | Freq: Once | ORAL | Status: AC
  Administered 2021-08-07: 1 mg via ORAL
  Filled 2021-08-07: qty 1

## 2021-08-07 MED ORDER — CLONAZEPAM 0.5 MG PO TABS
0.5000 mg | ORAL_TABLET | Freq: Every day | ORAL | Status: DC
  Administered 2021-08-08 – 2021-08-09 (×2): 0.5 mg via ORAL
  Filled 2021-08-07 (×3): qty 1

## 2021-08-07 NOTE — [facility] Counselor (Signed)
Adult Comprehensive Assessment  Patient ID: Brian Thomas, male   DOB: November 14, 1981, 40 y.o.   MRN: [id number]  Information Source: Information source: Patient  Current Stressors:  Patient states their primary concerns and needs for treatment are:: "I have been using Ativan 3 as prescribed but I dont want to anymore" Patient states their goals for this hospitilization and ongoing recovery are:: "To get off my Ativan" Educational / Learning stressors: Pt reports having an associates degree in funeral services Employment / Job issues: Pt reports working for [employer] paper Family Relationships: Pt reports no stressors Surveyor, quantity / Lack of resources (include bankruptcy): Pt reports reports no stressors Housing / Lack of housing: Pt reports having his own home Physical health (include injuries & life threatening diseases): Pt reports no stressors Social relationships: Pt reports no stressors Substance abuse: Pt reports that he does not use to get high but does not want to take anymore, uses Delta 8 2 times a week "when daughter is not there" Bereavement / Loss: Pt reports father passed in 2020-09-29 Living/Environment/Situation:  Living Arrangements: Children Living conditions (as described by patient or guardian): Home/Own Who else lives in the home?: Daughter on weekends only How long has patient lived in current situation?: 2 years What is atmosphere in current home: Comfortable, Supportive  Family History:  Marital status: Divorced Divorced, when?: 4 years Are you sexually active?: Yes What is your sexual orientation?: Heterosexual Has your sexual activity been affected by drugs, alcohol, medication, or emotional stress?: No Does patient have children?: Yes How many children?: 1 How is patient's relationship with their children?: "We get along well"  Childhood History:  By whom was/is the patient raised?: Both parents Description of patient's relationship with caregiver  when they were a child: "We get along really well" Patient's description of current relationship with people who raised him/her: "My father passed away in 11-07-21and I get along well with my mother" How were you disciplined when you got in trouble as a child/adolescent?: Spankings and groundings Does patient have siblings?: Yes Number of Siblings: 1 Description of patient's current relationship with siblings: "We get along really well" Did patient suffer any verbal/emotional/physical/sexual abuse as a child?: No Did patient suffer from severe childhood neglect?: No Has patient ever been sexually abused/assaulted/raped as an adolescent or adult?: No Was the patient ever a victim of a crime or a disaster?: No Witnessed domestic violence?: No Has patient been affected by domestic violence as an adult?: No Description of domestic violence: Has witnessed it as an adult.  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?: No  Financial Resources:   Financial resources: Income from employment, Private insurance Does patient have a representative payee or guardian?: No  Alcohol/Substance Abuse:   What has been your use of drugs/alcohol within the last 12 months?: Pt reports that he does not use his medication to get his but does not  want to take it anymore to be safe, also uses Delta 8 2  times a week when daughter is not there If attempted suicide, did drugs/alcohol play a role in this?: Yes (Pt believes Ativan causes passive SI) Alcohol/Substance Abuse Treatment Hx: Denies past history Has alcohol/substance abuse ever caused legal problems?: No  Social Support System:   Conservation officer, nature Support System: Fair Development worker, community Support System: Mother and friends Type of faith/religion: Ephriam Knuckles How does patient's faith help to cope with current illness?: Prayer  Leisure/Recreation:   Do You Have Hobbies?: Yes Leisure and Hobbies: Watching TV  Strengths/Needs:   What is the patient's perception of their strengths?: "I am not sure yet" Patient states they can use these personal strengths during their treatment to contribute to their recovery: N/A Patient states these barriers may affect/interfere with their treatment: None Patient states these barriers may affect their return to the community: None Other important information patient would like considered in planning for their treatment: None  Discharge Plan:   Currently receiving community mental health services: No Patient states concerns and preferences for aftercare planning are: Pt is interestsed in therapy and medication management Patient states they will know when they are safe and ready for discharge when: "When I can get off of my Ativan" Does patient have access to transportation?: Yes (Pt reports having his own car at home) Does patient have financial barriers related to discharge medications?: No Will patient be returning to same living situation after discharge?: Yes  Summary/Recommendations:   Summary and Recommendations (to be completed by the evaluator): Brian Thomas is a 40 year old, male, who was admitted to the hospital due to worsening depression, anxiety, and passive suicidal thoughts.  The Pt reports that he has been taking Ativan for 2 years and has been taking it as prescribed.  He states  that he has lost weight while taking the medication and feels that its effects have impacted him negatively and he no longer wishes to take the medication.  The Pt reports living in his own home and states that his daughter stays with him on the weekends.  The Pt reports plenty of family and social support.  The Pt reports that his father passed away in Sep 19, 2020 but his mother is alive and continues to be an emotional support.  The Pt reports having an associates degree in funeral services and is employed at [employer].  The Pt reports using Delta 8 two times a week when his daughter is not there but denies all other substance use.  The Pt dnies any previous susbtance use treatment but does feel that his Ativan prescription causes him to have passive suicidal thoughts.  While in the hospital the Pt can benefit from crisis stabilization, medication evaluation, group therapy, psycho-education, case management, and discharge planning.  Upon discharge the Pt will return to his home and will follow up with [facility] in [facility] for outpatient therapy and medication management.  Aram Beecham. 08/07/2021

## 2021-08-07 NOTE — [facility] Group Notes (Signed)
[facility] Group Notes:  (Nursing/MHT/Case Management/Adjunct)  Date:  08/07/2021  Time:  9:51 AM  Type of Therapy:  Group Therapy  Participation Level:  Active  Participation Quality:  Appropriate  Affect:  Appropriate  Cognitive:  Appropriate  Insight:  Appropriate  Engagement in Group:  Engaged  Modes of Intervention:  Discussion  Summary of Progress/Problems:Pt was engaged in group.  Jaquita Rector 08/07/2021, 9:51 AM

## 2021-08-07 NOTE — Progress Notes (Signed)
[facility] MD Progress Note  08/07/2021 1:40 PM Brian Thomas  MRN:  [id number]  Reason for admission:  Brian Thomas is a 40 year old male with past psychiatric history of anxiety and depression who presented as a walk-in to [facility] reporting worsening depression, worsening anxiety, increased suicidal ideation and requesting "help getting off Ativan."   Objective: Medical record reviewed.  Patient's case discussed in detail with nursing staff and members of the treatment team.  I met with and evaluated the patient on the unit today for follow-up.  Patient reports feeling less anxious today than he did yesterday evening, but still anxious overall.  His affect is congruent with occasional brief appropriate brightening.  There is no tremor or diaphoresis observed on exam.  Patient is mildly restless with intermittent leg bouncing which he states is typical for him when he is anxious.  He expresses worry about missing time from his job and inquires as to when he will be discharged.  Patient states he felt more anxious last night after he took his last dose of lorazepam yesterday and felt anxious again this morning prior to morning medications.  He denies passive wish for death, SI, AI, HI, PI, panic attacks, AH or VH.  Appetite is okay.  Sleep is fair.  He denies tremor, nausea, vomiting, diarrhea or other benzodiazepine withdrawal symptoms.  He is receptive to changing from lorazepam to longer acting benzodiazepine such as clonazepam and pursuing slower taper off of clonazepam to be completed as an outpatient.  We discussed the possibility of initiating treatment with an SSRI for anxiety and depression in the hospital so that he would have some coverage for his anxiety after benzodiazepine taper is complete.  Patient states he would like to defer initiation of SSRI today.  We will discuss it again tomorrow.  Staff document the patient slept 6.25 hours last night.  Vital signs this morning include BP of 110/85 sitting and  129/85 standing; pulse 88 sitting and 107 standing; O2 sat of 97% on room air and temperature of 98.  Repeat vital signs at 1130 AM include BP of 102/62 and pulse of 59.  Patient is taking scheduled medications as prescribed.  He took PRN trazodone 50 mg at bedtime and PRN hydroxyzine 25 mg at bedtime last night for insomnia and anxiety.  Patient has not needed to take any PRN Ativan for elevated CIWA scores.  CIWA scores: 2 at 11:40 AM today; 2 at 6 AM today; and 1 at 6 PM last night.  Hepatitis panel was WNL with tests for hepatitis A, hepatitis B and hepatitis C all nonreactive.   Principal Problem: GAD (generalized anxiety disorder) Diagnosis: Principal Problem:   GAD (generalized anxiety disorder) Active Problems:   MDD (major depressive disorder), recurrent severe, without psychosis ([place])   Benzodiazepine dependence ([place])  Total Time spent with patient:  25 minutes  Past Psychiatric History: Patient reports prior psychiatric diagnoses of depression, anxiety, panic and OCD (he describes OCD symptoms as repeatedly asking the same question and seeking reassurance).  He reports 1 prior inpatient psychiatric hospitalization at [facility] in 2007 after he made a suicide attempt by taking an overdose of alprazolam pills.  Patient states that his only suicide attempt was the overdose precipitating his 2007 admission to the hospital.  Per chart review patient has a remote history of self-injurious behavior by intentionally burning himself but has not engaged in this behavior since approximately 2007 or 2008.  He reports past medication trials of Lexapro (made him jittery), risperidone (  weight gain), Wellbutrin (did not work), Abilify (poor efficacy, possible increased anxiety), and Ativan.  Chart review also indicates patient may have been treated with Xanax in the past.  Patient states that he is currently treated by his PCP and does not have a therapist.  He does not believe he has taken Zoloft, Prozac, Paxil or  other antidepressants (besides those listed above) for anxiety and depression in the past.  Past Medical History:  Past Medical History:  Diagnosis Date   Pneumonia    History reviewed. No pertinent surgical history. Family History: History reviewed. No pertinent family history. Family Psychiatric  History: Patient states that he has a brother who had problems with alcohol in the past but no longer drinks.  He reports a history of suicide in a maternal cousin.  He denies other known family psychiatric history. Social History:  Social History   Substance and Sexual Activity  Alcohol Use No     Social History   Substance and Sexual Activity  Drug Use Yes   Comment: takes Delta 8 last use Friday 08/01/21    Social History   Socioeconomic History   Marital status: Divorced    Spouse name: Not on file   Number of children: 1   Years of education: Not on file   Highest education level: Not on file  Occupational History   Not on file  Tobacco Use   Smoking status: Every Day    Packs/day: 1.00    Years: 20.00    Pack years: 20.00    Types: Cigarettes   Smokeless tobacco: Never  Vaping Use   Vaping Use: Never used  Substance and Sexual Activity   Alcohol use: No   Drug use: Yes    Comment: takes Delta 8 last use Friday 08/01/21   Sexual activity: Not Currently  Other Topics Concern   Not on file  Social History Narrative   Not on file   Social Determinants of Health   Financial Resource Strain: Not on file  Food Insecurity: Not on file  Transportation Needs: Not on file  Physical Activity: Not on file  Stress: Not on file  Social Connections: Not on file   Additional Social History:    Pain Medications: None Prescriptions: Ativan, Abilify History of alcohol / drug use?: No history of alcohol / drug abuse Withdrawal Symptoms: Tremors                    Sleep: Fair  Appetite:  Fair  Current Medications: Current Facility-Administered Medications   Medication Dose Route Frequency Provider Last Rate Last Admin   acetaminophen (TYLENOL) tablet 650 mg  650 mg Oral Q6H PRN Prescilla Sours, PA-C       alum & mag hydroxide-simeth (MAALOX/MYLANTA) 200-200-20 MG/5ML suspension 30 mL  30 mL Oral Q4H PRN Prescilla Sours, PA-C       [START ON 08/08/2021] clonazePAM (KLONOPIN) tablet 0.5 mg  0.5 mg Oral Daily Arthor Captain, MD       clonazePAM (KLONOPIN) tablet 1 mg  1 mg Oral QPM Arthor Captain, MD       feeding supplement (ENSURE ENLIVE / ENSURE PLUS) liquid 237 mL  237 mL Oral Q24H Nelda Marseille, Amy E, MD   237 mL at 08/07/21 1123   hydrOXYzine (ATARAX/VISTARIL) tablet 25 mg  25 mg Oral Q6H PRN Arthor Captain, MD   25 mg at 08/06/21 2317   loperamide (IMODIUM) capsule 2-4 mg  2-4 mg Oral  PRN Margorie John W, PA-C       LORazepam (ATIVAN) tablet 1 mg  1 mg Oral Q6H PRN Arthor Captain, MD       magnesium hydroxide (MILK OF MAGNESIA) suspension 30 mL  30 mL Oral Daily PRN Margorie John W, PA-C       multivitamin with minerals tablet 1 tablet  1 tablet Oral Daily Prescilla Sours, PA-C   1 tablet at 08/07/21 0840   nicotine polacrilex (NICORETTE) gum 2 mg  2 mg Oral PRN Prescilla Sours, PA-C   2 mg at 08/07/21 1318   ondansetron (ZOFRAN-ODT) disintegrating tablet 4 mg  4 mg Oral Q6H PRN Margorie John W, PA-C       potassium chloride SA (KLOR-CON) CR tablet 20 mEq  20 mEq Oral BID Arthor Captain, MD   20 mEq at 08/07/21 0840   thiamine tablet 100 mg  100 mg Oral Daily Arthor Captain, MD   100 mg at 08/07/21 0840   traZODone (DESYREL) tablet 50 mg  50 mg Oral QHS PRN Prescilla Sours, PA-C   50 mg at 08/06/21 2138    Lab Results:  Results for orders placed or performed during the hospital encounter of 08/05/21 (from the past 48 hour(s))  Resp panel by RT-PCR (RSV, Flu A&B, Covid)     Status: None   Collection Time: 08/05/21 11:23 PM  Result Value Ref Range   SARS Coronavirus 2 by RT PCR NEGATIVE NEGATIVE    Comment: (NOTE) SARS-CoV-2 target nucleic acids  are NOT DETECTED.  The SARS-CoV-2 RNA is generally detectable in upper respiratory specimens during the acute phase of infection. The lowest concentration of SARS-CoV-2 viral copies this assay can detect is 138 copies/mL. A negative result does not preclude SARS-Cov-2 infection and should not be used as the sole basis for treatment or other patient management decisions. A negative result may occur with  improper specimen collection/handling, submission of specimen other than nasopharyngeal swab, presence of viral mutation(s) within the areas targeted by this assay, and inadequate number of viral copies(<138 copies/mL). A negative result must be combined with clinical observations, patient history, and epidemiological information. The expected result is Negative.  Fact Sheet for Patients:  EntrepreneurPulse.com.au  Fact Sheet for Healthcare Providers:  IncredibleEmployment.be  This test is no t yet approved or cleared by the [place] FDA and  has been authorized for detection and/or diagnosis of SARS-CoV-2 by FDA under an Emergency Use Authorization (EUA). This EUA will remain  in effect (meaning this test can be used) for the duration of the COVID-19 declaration under Section 564(b)(1) of the Act, 21 U.S.C.section 360bbb-3(b)(1), unless the authorization is terminated  or revoked sooner.       Influenza A by PCR NEGATIVE NEGATIVE   Influenza B by PCR NEGATIVE NEGATIVE    Comment: (NOTE) The Xpert Xpress SARS-CoV-2/FLU/RSV plus assay is intended as an aid in the diagnosis of influenza from Nasopharyngeal swab specimens and should not be used as a sole basis for treatment. Nasal washings and aspirates are unacceptable for Xpert Xpress SARS-CoV-2/FLU/RSV testing.  Fact Sheet for Patients: EntrepreneurPulse.com.au  Fact Sheet for Healthcare Providers: IncredibleEmployment.be  This test is not yet  approved or cleared by the [place] FDA and has been authorized for detection and/or diagnosis of SARS-CoV-2 by FDA under an Emergency Use Authorization (EUA). This EUA will remain in effect (meaning this test can be used) for the duration of the COVID-19 declaration under Section 564(b)(1)  of the Act, 21 U.S.C. section 360bbb-3(b)(1), unless the authorization is terminated or revoked.     Resp Syncytial Virus by PCR NEGATIVE NEGATIVE    Comment: (NOTE) Fact Sheet for Patients: EntrepreneurPulse.com.au  Fact Sheet for Healthcare Providers: IncredibleEmployment.be  This test is not yet approved or cleared by the [place] FDA and has been authorized for detection and/or diagnosis of SARS-CoV-2 by FDA under an Emergency Use Authorization (EUA). This EUA will remain in effect (meaning this test can be used) for the duration of the COVID-19 declaration under Section 564(b)(1) of the Act, 21 U.S.C. section 360bbb-3(b)(1), unless the authorization is terminated or revoked.  Performed at [facility], [place] [address], [place], [place] [address]   Urine rapid drug screen (hosp performed)not at [facility]     Status: Abnormal   Collection Time: 08/06/21  2:20 AM  Result Value Ref Range   Opiates NONE DETECTED NONE DETECTED   Cocaine NONE DETECTED NONE DETECTED   Benzodiazepines POSITIVE (A) NONE DETECTED   Amphetamines NONE DETECTED NONE DETECTED   Tetrahydrocannabinol POSITIVE (A) NONE DETECTED   Barbiturates NONE DETECTED NONE DETECTED    Comment: (NOTE) DRUG SCREEN FOR MEDICAL PURPOSES ONLY.  IF CONFIRMATION IS NEEDED FOR ANY PURPOSE, NOTIFY LAB WITHIN 5 DAYS.  LOWEST DETECTABLE LIMITS FOR URINE DRUG SCREEN Drug Class                     Cutoff (ng/mL) Amphetamine and metabolites    1000 Barbiturate and metabolites    200 Benzodiazepine                 604 Tricyclics and metabolites     300 Opiates and metabolites         300 Cocaine and metabolites        300 THC                            50 Performed at [facility], [place] [address], [place], [place] [address]   Glucose, capillary     Status: None   Collection Time: 08/06/21  6:11 AM  Result Value Ref Range   Glucose-Capillary 88 70 - 99 mg/dL    Comment: Glucose reference range applies only to samples taken after fasting for at least 8 hours.  CBC     Status: None   Collection Time: 08/06/21  6:20 AM  Result Value Ref Range   WBC 8.7 4.0 - 10.5 K/uL   RBC 4.67 4.22 - 5.81 MIL/uL   Hemoglobin 15.2 13.0 - 17.0 g/dL   HCT 45.0 39.0 - 52.0 %   MCV 96.4 80.0 - 100.0 fL   MCH 32.5 26.0 - 34.0 pg   MCHC 33.8 30.0 - 36.0 g/dL   RDW 12.1 11.5 - 15.5 %   Platelets 219 150 - 400 K/uL   nRBC 0.0 0.0 - 0.2 %    Comment: Performed at [facility], [place] [address] [place], [place] [address]  Comprehensive metabolic panel     Status: Abnormal   Collection Time: 08/06/21  6:20 AM  Result Value Ref Range   Sodium 140 135 - 145 mmol/L   Potassium 3.4 (L) 3.5 - 5.1 mmol/L   Chloride 105 98 - 111 mmol/L   CO2 28 22 - 32 mmol/L   Glucose, Bld 93 70 - 99 mg/dL    Comment: Glucose reference range applies only to samples  taken after fasting for at least 8 hours.   BUN 13 6 - 20 mg/dL   Creatinine, Ser 0.66 0.61 - 1.24 mg/dL   Calcium 9.3 8.9 - 10.3 mg/dL   Total Protein 6.8 6.5 - 8.1 g/dL   Albumin 4.1 3.5 - 5.0 g/dL   AST 26 15 - 41 U/L   ALT 30 0 - 44 U/L   Alkaline Phosphatase 65 38 - 126 U/L   Total Bilirubin 0.6 0.3 - 1.2 mg/dL   GFR, Estimated >60 >60 mL/min    Comment: (NOTE) Calculated using the CKD-EPI Creatinine Equation (2021)    Anion gap 7 5 - 15    Comment: Performed at [facility], [place] [address], [place], [place] [address]  Hemoglobin A1c     Status: Abnormal   Collection Time: 08/06/21  6:20 AM  Result Value Ref Range   Hgb A1c MFr Bld 4.6 (L) 4.8 - 5.6 %    Comment:  (NOTE) Pre diabetes:          5.7%-6.4%  Diabetes:              >6.4%  Glycemic control for   <7.0% adults with diabetes    Mean Plasma Glucose 85.32 mg/dL    Comment: Performed at [facility] [address] [place], [place] [address]  Lipid panel     Status: Abnormal   Collection Time: 08/06/21  6:20 AM  Result Value Ref Range   Cholesterol 169 0 - 200 mg/dL   Triglycerides 68 <150 mg/dL   HDL 39 (L) >40 mg/dL   Total CHOL/HDL Ratio 4.3 RATIO   VLDL 14 0 - 40 mg/dL   LDL Cholesterol 116 (H) 0 - 99 mg/dL    Comment:        Total Cholesterol/HDL:CHD Risk Coronary Heart Disease Risk Table                     Men   Women  1/2 Average Risk   3.4   3.3  Average Risk       5.0   4.4  2 X Average Risk   9.6   7.1  3 X Average Risk  23.4   11.0        Use the calculated Patient Ratio above and the CHD Risk Table to determine the patient's CHD Risk.        ATP III CLASSIFICATION (LDL):  <100     mg/dL   Optimal  100-129  mg/dL   Near or Above                    Optimal  130-159  mg/dL   Borderline  160-189  mg/dL   High  >190     mg/dL   Very High Performed at [place] [address], [place], [place] [address]   TSH     Status: None   Collection Time: 08/06/21  6:20 AM  Result Value Ref Range   TSH 1.023 0.350 - 4.500 uIU/mL    Comment: Performed by a 3rd Generation assay with a functional sensitivity of <=0.01 uIU/mL. Performed at [facility], [place] [address], [place], [place] [address]   RPR     Status: None   Collection Time: 08/06/21  6:20 AM  Result Value Ref Range   RPR Ser Ql NON REACTIVE NON REACTIVE    Comment: Performed at [facility],  [address], [place], [place] [address]  Hepatitis panel, acute     Status: None   Collection Time: 08/06/21  6:20 AM  Result Value Ref Range   Hepatitis B Surface Ag NON REACTIVE NON REACTIVE   HCV Ab NON REACTIVE NON REACTIVE    Comment: (NOTE) Nonreactive HCV  antibody screen is consistent with no HCV infections,  unless recent infection is suspected or other evidence exists to indicate HCV infection.     Hep A IgM NON REACTIVE NON REACTIVE   Hep B C IgM NON REACTIVE NON REACTIVE    Comment: Performed at [facility], [place] [address] [place], [place] [address]  HIV Antibody (routine testing w rflx)     Status: None   Collection Time: 08/06/21  6:20 AM  Result Value Ref Range   HIV Screen 4th Generation wRfx Non Reactive Non Reactive    Comment: Performed at [facility], [place] [address] [place], [place] [address]    Blood Alcohol level:  No results found for: [facility]  Metabolic Disorder Labs: Lab Results  Component Value Date   HGBA1C 4.6 (L) 08/06/2021   MPG 85.32 08/06/2021   No results found for: PROLACTIN Lab Results  Component Value Date   CHOL 169 08/06/2021   TRIG 68 08/06/2021   HDL 39 (L) 08/06/2021   CHOLHDL 4.3 08/06/2021   VLDL 14 08/06/2021   LDLCALC 116 (H) 08/06/2021    Physical Findings: AIMS:  , ,  ,  ,    CIWA:  CIWA-Ar Total: 2 COWS:     Musculoskeletal: Strength & Muscle Tone: within normal limits Gait & Station: normal Patient leans: N/A  Psychiatric Specialty Exam:  Presentation  General Appearance: Appropriate for Environment; Fairly Groomed  Eye Contact:Good  Speech:Clear and Coherent; Normal Rate  Speech Volume:Normal  Handedness: No data recorded  Mood and Affect  Mood:Anxious  Affect:Congruent   Thought Process  Thought Processes:Coherent; Goal Directed  Descriptions of Associations:Intact  Orientation:Full (Time, Place and Person)  Thought Content:Logical; Rumination (ruminative worry about possible negative future events)  History of Schizophrenia/Schizoaffective disorder:No  Duration of Psychotic Symptoms:N/A  Hallucinations:Hallucinations: None  Ideas of Reference:None  Suicidal Thoughts:Suicidal Thoughts: No  Homicidal Thoughts:Homicidal Thoughts:  No   Sensorium  Memory:Immediate Good; Recent Good; Remote Good  Judgment:Fair  Insight:Fair   Executive Functions  Concentration:Good  Attention Span:Good  [place] of Knowledge:Good  Language:Good   Psychomotor Activity  Psychomotor Activity:Psychomotor Activity: Restlessness   Assets  Assets:Communication Skills; Desire for Improvement; Financial Resources/Insurance; Housing; Physical Health; Resilience; Social Support; Vocational/Educational   Sleep  Sleep:Sleep: Fair Number of Hours of Sleep: 6.25    Physical Exam: Physical Exam Vitals and nursing note reviewed.  Constitutional:      General: He is not in acute distress.    Appearance: Normal appearance. He is not diaphoretic.  HENT:     Head: Normocephalic and atraumatic.  Pulmonary:     Effort: Pulmonary effort is normal.  Neurological:     General: No focal deficit present.     Mental Status: He is alert and oriented to person, place, and time.   Review of Systems  Constitutional:  Negative for chills, diaphoresis and fever.  HENT:  Negative for sore throat.   Respiratory:  Negative for cough and shortness of breath.   Cardiovascular:  Negative for chest pain and palpitations.  Gastrointestinal:  Negative for constipation, diarrhea, nausea and vomiting.  Musculoskeletal: Negative.   Neurological:  Negative  for dizziness, tremors and seizures.  Psychiatric/Behavioral:  Negative for hallucinations and suicidal ideas. The patient is nervous/anxious.   All other systems reviewed and are negative. Blood pressure 102/62, pulse (!) 59, temperature 98 F (36.7 C), temperature source Oral, resp. rate 20, height _0  (1.778 m), weight 59.9 kg, SpO2 96 %. Body mass index is 18.94 kg/m.   Treatment Plan Summary: The patient is a 40 year old male with a history of depression, anxiety, and benzodiazepine dependence on prescribed lorazepam which he has taken as prescribed for the past 2 years who  was admitted with worsening symptoms of anxiety and depression, worsening suicidal thoughts and seeking help stopping Ativan.  Patient meets criteria for major depressive disorder, recurrent without psychotic features; generalized anxiety disorder with panic attacks and benzodiazepine dependence on prescribed benzodiazepines.  Continued inpatient psychiatric admission is necessary to facilitate taper and discontinuation of benzodiazepines and for treatment of anxiety and depressive symptoms.     Daily contact with patient to assess and evaluate symptoms and progress in treatment and Medication management  Benzodiazepine dependence -Discontinue standing dose lorazepam -Start clonazepam 1 mg Q 0800 and 1 mg Q 2000 with plan to decrease tomorrow to 0.5 mg Q 0800 and 1 mg Q 2000.  Plan to adjust dose as indicated/tolerated.  Goal of change from lorazepam to clonazepam is to facilitate more comfortable taper for patient off of benzodiazepines over a longer period of time in the outpatient setting. -Continue CIWA protocol with comfort meds and PRN lorazepam to cover for CIWA > 10.  Depression -I have advised patient to consider initiation of Zoloft or other SSRI for treatment of anxiety and depression.  Patient wishes to defer initiation of SSRI today.  Will readdress tomorrow.  Anxiety -I have advised patient to consider initiation of Zoloft or other SSRI for treatment of anxiety and depression.  Patient wishes to defer initiation of SSRI today.  Will readdress tomorrow. -Discontinue standing dose lorazepam -Start clonazepam 1 mg Q 0800 and 1 mg Q 2000 with plan to decrease tomorrow to 0.5 mg Q 0800 and 1 mg Q 2000.  Anticipate further taper and eventual discontinuation of clonazepam in the outpatient setting. -Continue hydroxyzine 25 mg Q6H PRN anxiety  Insomnia -Continue trazodone 50 mg at bedtime PRN  Hypokalemia -Continue Klor-Con 20 mill equivalent twice daily x3 doses. -Repeat BMP with  potassium scheduled for morning lab draw on 08/08/2021  Substance use/Delta 8 use -I have advised patient of the risks of delta 8 use including but not limited to the potential for increased anxiety and other psychiatric symptoms.   -Patient would benefit from participation in substance abuse treatment program after discharge.  Discharge planning in progress.  Patient will need referral to outpatient therapist and psychiatric medication management provider.   Arthor Captain, MD 08/07/2021, 1:40 PM

## 2021-08-07 NOTE — Progress Notes (Signed)
[facility] Group Notes:  (Nursing/MHT/Case Management/Adjunct)  Date:  08/07/2021  Time:  2015 Type of Therapy:   wrap up group  Participation Level:  Active  Participation Quality:  Appropriate, Attentive, Sharing, and Supportive  Affect:  Anxious and Resistant  Cognitive:  Alert  Insight:  Improving  Engagement in Group:  Engaged  Modes of Intervention:  Clarification, Education, and Support  Summary of Progress/Problems: Positive thinking and positive self-image were discussed.   Brian Thomas 08/07/2021, 8:54 PM

## 2021-08-07 NOTE — Progress Notes (Signed)
DAR NOTE: Patient presents with anxious affect and depressed mood.  Denies pain, auditory and visual hallucinations. Pt complained of not being able to sleep due to increased anxiety. Vistaril 25 mg Po given.  Maintained on routine safety checks.  Medications given as prescribed.  Support and encouragement offered as needed.  Will continue to monitor.

## 2021-08-07 NOTE — [facility] Group Notes (Signed)
Adult Psychoeducational Group Note  Date:  08/07/2021 Time:  5:20 PM  Group Topic/Focus:  Wellness Toolbox:   The focus of this group is to discuss various aspects of wellness, balancing those aspects and exploring ways to increase the ability to experience wellness.  Patients will create a wellness toolbox for use upon discharge.  Participation Level:  Active  Participation Quality:  Appropriate  Affect:  Appropriate  Cognitive:  Appropriate  Insight: Appropriate  Engagement in Group:  Engaged  Modes of Intervention:  Activity  Additional Comments:  Patient attended and participated in the Psycho-Ed group.  Jearl Klinefelter 08/07/2021, 5:20 PM

## 2021-08-07 NOTE — Progress Notes (Signed)
   08/07/21 1115  Psych Admission Type (Psych Patients Only)  Admission Status Voluntary  Psychosocial Assessment  Patient Complaints Depression;Anxiety  Eye Contact Fair  Facial Expression Anxious  Affect Anxious;Appropriate to circumstance  Speech Logical/coherent  Interaction Assertive  Motor Activity Slow  Appearance/Hygiene Unremarkable  Behavior Characteristics Cooperative  Mood Anxious  Thought Process  Coherency WDL  Content WDL  Delusions None reported or observed  Perception WDL  Hallucination None reported or observed  Judgment WDL  Confusion None  Danger to Self  Current suicidal ideation? Denies  Danger to Others  Danger to Others None reported or observed

## 2021-08-07 NOTE — [facility] Suicide Risk Assessment (Signed)
[facility] INPATIENT:  Family/Significant Other Suicide Prevention Education  Suicide Prevention Education:  Education Completed; Rajon Bisig [phone] (Mother) has been identified by the patient as the family member/significant other with whom the patient will be residing, and identified as the person(s) who will aid the patient in the event of a mental health crisis (suicidal ideations/suicide attempt).  With written consent from the patient, the family member/significant other has been provided the following suicide prevention education, prior to the and/or following the discharge of the patient.  The suicide prevention education provided includes the following: Suicide risk factors Suicide prevention and interventions National Suicide Hotline telephone number [facility] assessment telephone number [facility] Emergency Assistance [address] [facility] and/or Residential Mobile Crisis Unit telephone number  Request made of family/significant other to: Remove weapons (e.g., guns, rifles, knives), all items previously/currently identified as safety concern.   Remove drugs/medications (over-the-counter, prescriptions, illicit drugs), all items previously/currently identified as a safety concern.  The family member/significant other verbalizes understanding of the suicide prevention education information provided.  The family member/significant other agrees to remove the items of safety concern listed above.  CSW spoke with Mrs. Kroh who states that her son has told her that he wants to come off his Ativan medications.  She states that her son has a lot of Anxiety and paranoia.  She states that he often carries his medication with him and is agitated.  She states that her son has never had any other substance use concerns.  Mrs. Markie states that there are no firearms or weapons in her sons homes that she is aware of.  Mrs. Galluzzo states that she will make sure to check on her son  regularly after discharge and make sure that her son is going to his appointments and that his medications are working for him.  CSW completed SPE with Mrs. Sadler.    Brian Thomas Brian Thomas 08/07/2021, 3:50 PM

## 2021-08-08 LAB — BASIC METABOLIC PANEL
Anion gap: 6 (ref 5–15)
BUN: 19 mg/dL (ref 6–20)
CO2: 28 mmol/L (ref 22–32)
Calcium: 8.9 mg/dL (ref 8.9–10.3)
Chloride: 106 mmol/L (ref 98–111)
Creatinine, Ser: 0.88 mg/dL (ref 0.61–1.24)
GFR, Estimated: >60 mL/min (ref >60)
Glucose, Bld: 98 mg/dL (ref 70–99)
Potassium: 4.1 mmol/L (ref 3.5–5.1)
Sodium: 140 mmol/L (ref 135–145)

## 2021-08-08 MED ORDER — SERTRALINE HCL 25 MG PO TABS
25.0000 mg | ORAL_TABLET | Freq: Every day | ORAL | Status: DC
  Administered 2021-08-08 – 2021-08-09 (×2): 25 mg via ORAL
  Filled 2021-08-08 (×5): qty 1

## 2021-08-08 NOTE — Group Note (Signed)
Recreation Therapy Group Note   Group Topic:Stress Management  Group Date: 08/08/2021 Start Time: 0930 End Time: 0950 Facilitators: Caroll Rancher, LRT/CTRS Location: [address] Dayroom  Goal Area(s) Addresses:  Patient will identify positive stress management techniques. Patient will identify benefits of using stress management post d/c.  Group Description: Meditation.  LRT played a meditation that focused on letting go of anxiety.  The meditation encouraged patients to release the things that were out of their control and to acknowledge any feels or thoughts they have without letting it overtake them.  Patients were to follow as meditation played to fully engage in activity.   Affect/Mood: N/A   Participation Level: Did not attend    Clinical Observations/Individualized Feedback: Pt did not attend group session.   Plan: Continue to engage patient in RT group sessions 2-3x/week.   Caroll Rancher, LRT/CTRS 08/08/2021 12:11 PM

## 2021-08-08 NOTE — Progress Notes (Signed)
   08/07/21 2354  Psych Admission Type (Psych Patients Only)  Admission Status Voluntary  Psychosocial Assessment  Patient Complaints Anxiety  Eye Contact Fair  Facial Expression Anxious  Affect Anxious;Appropriate to circumstance  Speech Logical/coherent  Interaction Assertive  Motor Activity Slow  Appearance/Hygiene Unremarkable  Behavior Characteristics Cooperative;Calm  Mood Pleasant  Thought Process  Coherency WDL  Content WDL  Delusions None reported or observed  Perception WDL  Hallucination None reported or observed  Judgment WDL  Confusion None  Danger to Self  Current suicidal ideation? Denies  Danger to Others  Danger to Others None reported or observed

## 2021-08-08 NOTE — Progress Notes (Signed)
Chaplain received a consult that patient wanted to discuss Advance Directive.  Chaplain was unable to meet with patient to discuss this.  If patient remains in patient next week, chaplain will attempt again to meet with patient.  Chaplain Dyanne Carrel, Bcc Pager, [phone] 9:02 PM

## 2021-08-08 NOTE — Progress Notes (Signed)
[facility] MD Progress Note  08/08/2021 4:39 PM Brian Thomas  MRN:  [id number]  Reason for admission:  Brian Thomas is a 40 year old male with past psychiatric history of anxiety and depression who presented as a walk-in to [facility] reporting worsening depression, worsening anxiety, increased suicidal ideation and requesting "help getting off Ativan."   Objective: Medical record reviewed.  Patient's case discussed in detail with nursing staff and members of the treatment team.  I met with and evaluated the patient on the unit today for follow-up.  Patient appears improved today.  He looks calmer, less anxious and less fidgety.  He subjectively reports that he is doing very well.  Patient denies anxiety or depressed mood.  He rates his anxiety at 1/10 in intensity (down from 6/10 on admission).  Patient states that his anxiety is still well managed today even though morning dose of clonazepam was decreased from 1 mg to 0.5 mg today.  Patient denies depressed mood, passive wish for death, SI, AI, HI, AH, VH or PI.  He denies significant depression or SI when he presented to [facility] as a walk-in.  Patient says that he felt overwhelmed by the stress of his job and being a single parent and had fleeting thoughts of not wanting to be alive, dying or running away but denies ever having actual plan or intent to end his life.  He states that he is eager for discharge because he wants to go home and feed his cat.  Patient denies tremor, nausea, vomiting, diaphoresis or any symptoms of benzodiazepine withdrawal.  There is no tremor or diaphoresis observed on exam.  Patient says that he feels better on clonazepam than he did on lorazepam.  I advised him to allow me to start sertraline today with the plan that it would eventually help control his anxiety after he is tapered off of clonazepam.  Patient is in agreement with starting sertraline today.  We discussed the plan is for a slow taper as an outpatient off of standing dose  clonazepam.  Patient expresses desire to eventually get off of benzodiazepines and is in agreement with slow taper off of clonazepam as an outpatient.  He states that he would like the [facility] hospital pharmacy to destroy his supply of lorazepam which she has with his belongings in his locker at the hospital.  Patient denies medication side effects.  He denies ataxia, slurred speech, sedation, dizziness, orthostasis, trouble breathing, chest pain, palpitations, nausea, vomiting, constipation, diarrhea or other physical issues.  Staff document the patient slept 4.45 hours last night.  His CIWA scores have been 1 or 2 and he is only scoring for anxiety.  Vital signs this morning include BP of 112/76 sitting and 115/87 standing; pulse 74 sitting and 88 standing; respirations 16; O2 sat of 100% on room air and temperature of 98.  Repeat BMP drawn this morning was WNL with potassium of 4.1 up from 3.4 on admission.  Patient is taking scheduled medications as prescribed.  He took PRN trazodone 50 mg x 1 and PRN hydroxyzine 25 mg x 1 at 930 last night for sleep and anxiety.   Principal Problem: GAD (generalized anxiety disorder) Diagnosis: Principal Problem:   GAD (generalized anxiety disorder) Active Problems:   MDD (major depressive disorder), recurrent severe, without psychosis ([place])   Benzodiazepine dependence ([place])  Total Time spent with patient:  25 minutes  Past Psychiatric History: Patient reports prior psychiatric diagnoses of depression, anxiety, panic and OCD (he describes OCD symptoms as repeatedly  asking the same question and seeking reassurance).  He reports 1 prior inpatient psychiatric hospitalization at [facility] in 2007 after he made a suicide attempt by taking an overdose of alprazolam pills.  Patient states that his only suicide attempt was the overdose precipitating his 2007 admission to the hospital.  Per chart review patient has a remote history of self-injurious behavior by intentionally burning  himself but has not engaged in this behavior since approximately 2007 or 2008.  He reports past medication trials of Lexapro (made him jittery), risperidone (weight gain), Wellbutrin (did not work), Abilify (poor efficacy, possible increased anxiety), and Ativan.  Chart review also indicates patient may have been treated with Xanax in the past.  Patient states that he is currently treated by his PCP and does not have a therapist.  He does not believe he has taken Zoloft, Prozac, Paxil or other antidepressants (besides those listed above) for anxiety and depression in the past.  Past Medical History:  Past Medical History:  Diagnosis Date   Pneumonia    History reviewed. No pertinent surgical history. Family History: History reviewed. No pertinent family history. Family Psychiatric  History: Patient states that he has a brother who had problems with alcohol in the past but no longer drinks.  He reports a history of suicide in a maternal cousin.  He denies other known family psychiatric history. Social History:  Social History   Substance and Sexual Activity  Alcohol Use No     Social History   Substance and Sexual Activity  Drug Use Yes   Comment: takes Delta 8 last use Friday 08/01/21    Social History   Socioeconomic History   Marital status: Divorced    Spouse name: Not on file   Number of children: 1   Years of education: Not on file   Highest education level: Not on file  Occupational History   Not on file  Tobacco Use   Smoking status: Every Day    Packs/day: 1.00    Years: 20.00    Pack years: 20.00    Types: Cigarettes   Smokeless tobacco: Never  Vaping Use   Vaping Use: Never used  Substance and Sexual Activity   Alcohol use: No   Drug use: Yes    Comment: takes Delta 8 last use Friday 08/01/21   Sexual activity: Not Currently  Other Topics Concern   Not on file  Social History Narrative   Not on file   Social Determinants of Health   Financial Resource Strain:  Not on file  Food Insecurity: Not on file  Transportation Needs: Not on file  Physical Activity: Not on file  Stress: Not on file  Social Connections: Not on file   Additional Social History:    Pain Medications: None Prescriptions: Ativan, Abilify History of alcohol / drug use?: No history of alcohol / drug abuse Withdrawal Symptoms: Tremors                    Sleep: Fair  Appetite:  Fair  Current Medications: Current Facility-Administered Medications  Medication Dose Route Frequency Provider Last Rate Last Admin   acetaminophen (TYLENOL) tablet 650 mg  650 mg Oral Q6H PRN Prescilla Sours, PA-C       alum & mag hydroxide-simeth (MAALOX/MYLANTA) 200-200-20 MG/5ML suspension 30 mL  30 mL Oral Q4H PRN Margorie John W, PA-C       clonazePAM Bobbye [place]) tablet 0.5 mg  0.5 mg Oral Daily Arthor Captain, MD  0.5 mg at 08/08/21 0810   clonazePAM (KLONOPIN) tablet 1 mg  1 mg Oral QPM Arthor Captain, MD   1 mg at 08/07/21 2134   feeding supplement (ENSURE ENLIVE / ENSURE PLUS) liquid 237 mL  237 mL Oral Q24H Nelda Marseille, Amy E, MD   237 mL at 08/07/21 1123   hydrOXYzine (ATARAX/VISTARIL) tablet 25 mg  25 mg Oral Q6H PRN Arthor Captain, MD   25 mg at 08/07/21 2134   loperamide (IMODIUM) capsule 2-4 mg  2-4 mg Oral PRN Margorie John W, PA-C       LORazepam (ATIVAN) tablet 1 mg  1 mg Oral Q6H PRN Arthor Captain, MD       magnesium hydroxide (MILK OF MAGNESIA) suspension 30 mL  30 mL Oral Daily PRN Margorie John W, PA-C       multivitamin with minerals tablet 1 tablet  1 tablet Oral Daily Margorie John W, PA-C   1 tablet at 08/08/21 0809   nicotine polacrilex (NICORETTE) gum 2 mg  2 mg Oral PRN Prescilla Sours, PA-C   2 mg at 08/08/21 1416   ondansetron (ZOFRAN-ODT) disintegrating tablet 4 mg  4 mg Oral Q6H PRN Margorie John W, PA-C       sertraline (ZOLOFT) tablet 25 mg  25 mg Oral Daily Arthor Captain, MD   25 mg at 08/08/21 1415   thiamine tablet 100 mg  100 mg Oral Daily Arthor Captain,  MD   100 mg at 08/08/21 0809   traZODone (DESYREL) tablet 50 mg  50 mg Oral QHS PRN Prescilla Sours, PA-C   50 mg at 08/07/21 2135    Lab Results:  Results for orders placed or performed during the hospital encounter of 08/05/21 (from the past 48 hour(s))  Basic metabolic panel     Status: None   Collection Time: 08/08/21  6:18 AM  Result Value Ref Range   Sodium 140 135 - 145 mmol/L   Potassium 4.1 3.5 - 5.1 mmol/L    Comment: DELTA CHECK NOTED NO VISIBLE HEMOLYSIS    Chloride 106 98 - 111 mmol/L   CO2 28 22 - 32 mmol/L   Glucose, Bld 98 70 - 99 mg/dL    Comment: Glucose reference range applies only to samples taken after fasting for at least 8 hours.   BUN 19 6 - 20 mg/dL   Creatinine, Ser 0.88 0.61 - 1.24 mg/dL   Calcium 8.9 8.9 - 10.3 mg/dL   GFR, Estimated >60 >60 mL/min    Comment: (NOTE) Calculated using the CKD-EPI Creatinine Equation (2021)    Anion gap 6 5 - 15    Comment: Performed at [facility], [place] [address] [place], [place] [address]    Blood Alcohol level:  No results found for: [facility]  Metabolic Disorder Labs: Lab Results  Component Value Date   HGBA1C 4.6 (L) 08/06/2021   MPG 85.32 08/06/2021   No results found for: PROLACTIN Lab Results  Component Value Date   CHOL 169 08/06/2021   TRIG 68 08/06/2021   HDL 39 (L) 08/06/2021   CHOLHDL 4.3 08/06/2021   VLDL 14 08/06/2021   LDLCALC 116 (H) 08/06/2021    Physical Findings: AIMS:  , ,  ,  ,    CIWA:  CIWA-Ar Total: 1 COWS:     Musculoskeletal: Strength & Muscle Tone: within normal limits Gait & Station: normal Patient leans: N/A  Psychiatric Specialty Exam:  Presentation  General Appearance:  Appropriate for Environment; Fairly Groomed  Eye Contact:Good  Speech:Clear and Coherent; Normal Rate  Speech Volume:Normal  Handedness: No data recorded  Mood and Affect  Mood:Anxious  Affect:Congruent   Thought Process  Thought Processes:Coherent; Goal  Directed  Descriptions of Associations:Intact  Orientation:Full (Time, Place and Person)  Thought Content:Logical  History of Schizophrenia/Schizoaffective disorder:No  Duration of Psychotic Symptoms:N/A  Hallucinations:Hallucinations: None  Ideas of Reference:None  Suicidal Thoughts:Suicidal Thoughts: No  Homicidal Thoughts:Homicidal Thoughts: No   Sensorium  Memory:Immediate Good; Recent Good; Remote Good  Judgment:Fair  Insight:Fair   Executive Functions  Concentration:Good  Attention Span:Good  [place] of Knowledge:Good  Language:Good   Psychomotor Activity  Psychomotor Activity:Psychomotor Activity: Restlessness (Intermittently bounces leg during evaluation)   Assets  Assets:Communication Skills; Desire for Improvement; Financial Resources/Insurance; Housing; Physical Health; Resilience; Social Support; Vocational/Educational   Sleep  Sleep:Sleep: Good Number of Hours of Sleep: 4.45    Physical Exam: Physical Exam Vitals and nursing note reviewed.  Constitutional:      General: He is not in acute distress.    Appearance: Normal appearance. He is not diaphoretic.  HENT:     Head: Normocephalic and atraumatic.  Cardiovascular:     Rate and Rhythm: Normal rate.  Pulmonary:     Effort: Pulmonary effort is normal.  Neurological:     General: No focal deficit present.     Mental Status: He is alert and oriented to person, place, and time.     Motor: No tremor.   Review of Systems  Constitutional:  Negative for chills, diaphoresis and fever.  HENT:  Negative for sore throat.   Respiratory:  Negative for cough and shortness of breath.   Cardiovascular:  Negative for chest pain and palpitations.  Gastrointestinal:  Negative for constipation, diarrhea, nausea and vomiting.  Musculoskeletal: Negative.   Neurological:  Negative for dizziness, tremors and seizures.  Psychiatric/Behavioral:  Negative for depression, hallucinations and  suicidal ideas. The patient is not nervous/anxious and does not have insomnia.   All other systems reviewed and are negative. Blood pressure 115/87, pulse 88, temperature 98 F (36.7 C), temperature source Oral, resp. rate 16, height $RemoveBe'5\' 10"'hfoWbgIxI$  (1.778 m), weight 59.9 kg, SpO2 100 %. Body mass index is 18.94 kg/m.   Treatment Plan Summary: The patient is a 40 year old male with a history of depression, anxiety, and benzodiazepine dependence on prescribed lorazepam which he has taken as prescribed (2 mg 3 times daily) for the past 2 years who was admitted with worsening symptoms of anxiety and depression, worsening suicidal thoughts and seeking help stopping Ativan.  Patient meets criteria for major depressive disorder, recurrent without psychotic features; generalized anxiety disorder with panic attacks and benzodiazepine dependence on prescribed benzodiazepines.  Patient has tolerated transition from lorazepam to standing dose clonazepam.  He is receptive to initiation of sertraline today with the goal of eventually discontinuing benzodiazepines as an outpatient and continuing sertraline to manage his anxiety symptoms.  Patient reports feeling better since admission with improved control of anxiety symptoms on clonazepam as compared to lorazepam.  Patient denies depressed mood and has been denying SI.  He is nearing readiness for discharge with plan for more gradual taper off of clonazepam as an outpatient to reduce the risk of benzodiazepine withdrawal. Daily contact with patient to assess and evaluate symptoms and progress in treatment and Medication management  Benzodiazepine dependence -Continue clonazepam at lower dose of 0.5 mg Q 0800 and 1 mg Q 2000.  Plan is for patient to discharge  on standing dose clonazepam rather than standing dose lorazepam to reduce the risk of benzodiazepine withdrawal and to facilitate more comfortable taper off of benzodiazepines over a longer period of time with less risk  of exacerbation of anxiety in the outpatient setting.  Patient had been taking prescribed lorazepam 2 mg 3 times daily regularly for the past 2 years prior to admission. -We will discontinue CIWA protocol as patient is not scoring and should be adequately covered on new standing dose of clonazepam.  Anxiety -Start sertraline 25 mg daily for anxiety and mood (initiated 08/08/2021). -Continue clonazepam at lower dose of 0.5 mg Q 0800 and 1 mg Q 2000.   Anticipate further taper and eventual discontinuation of clonazepam in the outpatient setting. -Continue hydroxyzine 25 mg Q6H PRN anxiety  Depression -Start sertraline 25 mg daily for anxiety and mood (initiated 08/08/2021).  Insomnia -Continue trazodone 50 mg at bedtime PRN  Hypokalemia -Resolved after potassium replacement.  Repeat potassium was 4.1 with a.m. draw this morning.  Substance use/Delta 8 use -I have advised patient of the risks of delta 8 use including but not limited to the potential for increased anxiety and other psychiatric symptoms.   -Patient would benefit from participation in substance abuse treatment program after discharge.  Discharge planning in progress.  Anticipate probable discharge within the next 24-48 hours.     Arthor Captain, MD 08/08/2021, 4:39 PM

## 2021-08-08 NOTE — Group Note (Signed)
LCSW Group Therapy Notes   Type of Therapy and Topic: Group Therapy: Worry and Anxiety  Participation Level: [facility] PARTICIPATION LEVEL: Active  Description of Group: In this group, patients will be encouraged to explore their worry around what could happen vs what will happen. Each patient will be challenged to think of personal worries and how they will work their way through that worry around what will happen and what could happen. This group will be process-oriented, with patients participating in exploration of their own experiences as well as giving and receiving support and challenge from other group members.  Therapeutic Goals: Patient will identify personal worries that cause anxiety. Patient will identify clues to identify their worry. Patient will identify ways to handle their worry. Patient will discuss ways that their worry has deceased or why it has not decreased.   Summary of Patient Progress: Patient participated well in group and discussed insight into current stressors that he is dealing with. Patient participated in an activity identifying things that he could control vs. Not control about the stressor.  Patient identified his relationships as being a source of stress.    Therapeutic Modalities:  Cognitive Behavioral Therapy Solution Focused Therapy Motivational Interviewing    Charmian Forbis, LCSW, LCAS Clincal Social Worker  [facility]

## 2021-08-08 NOTE — [facility] Group Notes (Signed)
Adult Psychoeducational Group Note  Date:  08/08/2021 Time:  11:19 AM  Group Topic/Focus:  Goals Group:   The focus of this group is to help patients establish daily goals to achieve during treatment and discuss how the patient can incorporate goal setting into their daily lives to aide in recovery.  Participation Level:  Active  Additional Comments:  Pt attended morning goals group with a goal of speaking to the MD.   Deforest Hoyles Parisa Pinela 08/08/2021, 11:19 AM

## 2021-08-09 DIAGNOSIS — F411 Generalized anxiety disorder: Secondary | ICD-10-CM

## 2021-08-09 MED ORDER — CLONAZEPAM 0.5 MG PO TABS: 0.5000 mg | ORAL_TABLET | Freq: Two times a day (BID) | ORAL | 0 refills | Status: DC

## 2021-08-09 MED ORDER — SERTRALINE HCL 25 MG PO TABS: 25.0000 mg | ORAL_TABLET | Freq: Every day | ORAL | 0 refills | Status: DC

## 2021-08-09 MED ORDER — NICOTINE POLACRILEX 2 MG MT GUM: 2.0000 mg | CHEWING_GUM | OROMUCOSAL | 0 refills | Status: DC | PRN

## 2021-08-09 NOTE — Progress Notes (Signed)
  [facility] Adult Case Management Discharge Plan :  Will you be returning to the same living situation after discharge:  Yes,  personal home At discharge, do you have transportation home?: No. Safe Transport Do you have the ability to pay for your medications: Yes,  private insurance/income  Release of information consent forms completed and in the chart;  Patient's signature needed at discharge.  Patient to Follow up at:  Follow-up Information     [facility] [employer] Go on 08/13/2021.   Why: You have a hospital follow up appointment for therapy and medication management services on 08/13/21 at 8:30 am.  This appointment will be held in person.  Please bring photo ID, insurance card to your appointment. Contact information: [address] [place] [address] [phone]                 Next level of care provider has access to [facility] Link:no  Safety Planning and Suicide Prevention discussed: Yes,  mother     Has patient been referred to the Quitline?: Patient refused referral  Patient has been referred for addiction treatment: Yes  Felizardo Hoffmann, LCSWA 08/09/2021, 11:27 AM

## 2021-08-09 NOTE — Discharge Instructions (Signed)
A prescription was sent to your pharmacy for Klonopin 0.5 mg tablets, amount 63 tablets. As discussed you are to taper by 1/2 tablet each week for the next 30 days. Please see your PCP for additional medication if needed for tapering. You have been advised to safely taper off of this medication given the side effects of abrupt withdrawal or discontinuation.

## 2021-08-09 NOTE — Progress Notes (Signed)
   08/08/21 2000  Psych Admission Type (Psych Patients Only)  Admission Status Voluntary  Psychosocial Assessment  Patient Complaints None  Eye Contact Fair  Facial Expression Other (Comment) (appropriate)  Affect Appropriate to circumstance;Anxious  Speech Logical/coherent  Interaction Assertive  Motor Activity Slow  Appearance/Hygiene Unremarkable  Behavior Characteristics Cooperative  Mood Pleasant  Thought Process  Coherency WDL  Content WDL  Delusions None reported or observed  Perception WDL  Hallucination None reported or observed  Judgment WDL  Confusion None  Danger to Self  Current suicidal ideation? Denies  Danger to Others  Danger to Others None reported or observed   Pt denies SI, HI, AVH and pain. Pt says he is feeling better and eager to get back to his outside life. Pt says he is off of Ativan and now on Klonopin. Pt says he will taper off of this as well outpatient. Denies anxiety and depression.

## 2021-08-09 NOTE — Progress Notes (Addendum)
   08/09/21 1000  Psych Admission Type (Psych Patients Only)  Admission Status Voluntary  Psychosocial Assessment  Patient Complaints None  Eye Contact Fair  Facial Expression Other (Comment) (appropriate)  Affect Appropriate to circumstance;Anxious  Speech Logical/coherent  Interaction Assertive  Motor Activity Slow  Appearance/Hygiene Unremarkable  Behavior Characteristics Cooperative  Mood Pleasant  Thought Process  Coherency WDL  Content WDL  Delusions None reported or observed  Perception WDL  Hallucination None reported or observed  Judgment WDL  Confusion None  Danger to Self  Current suicidal ideation? Denies  Danger to Others  Danger to Others None reported or observed    D. Pt presents with an anxious affect/ pleasant mood- rated his depression, hopelessness and anxiety all 0's on his self inventory. Pt reported that his goal today was "leaving and getting back to life".  Pt currently denies SI/HI and AVH   A. Labs and vitals monitored. Pt given and educated on medications. Pt supported emotionally and encouraged to express concerns and ask questions.   R. Pt remains safe with 15 minute checks. Will continue POC.

## 2021-08-09 NOTE — Group Note (Signed)
LCSW Group Therapy Note  08/09/2021      Type of Therapy and Topic:  Group Therapy: "My Mental Health"  Participation Level:  Active   Description of Group:   In this group, patients were asked four questions in order to generate discussion around the idea of mental illness and/or substance addiction being medical problems: In one sentence describe the current state of your mental health or substance use. How much do you feel similar to or different from others? Do you tend to identify with other people or compare yourself to them?  In a word or sentence, share what you desire your mental health or substance use to be moving forward.  Discussion was held that led to the conclusion that comparing ourselves to others is not healthy, but identifying with the elements of their issues that are similar to ours is helpful.    Therapeutic Goals:  Patients will identify their feelings about their current mental health/substance use problems. Patients will describe how they feel similar to or different from others, and whether they tend to identify with or compare themselves to other people with the same issues. Patients will explore the differences in these concepts and how a change of mindset about mental health/substance use can help with reaching recovery goals. Patients will think about and share what their recovery goals are, in terms of mental health and/or substance use.  Summary of Patient Progress:  The patient shared that he feels he is doing "great, no problems" at the moment.  He mostly listened during group rather than contributing, but he was attentive and agreed with elements of what other patients said at times.  At the end of group, he stated he would like his state of health to be "resilient" so that he could know he can bounce back quicker.  Therapeutic Modalities:   Processing Psychoeducation  Lynnell Chad, MSW, LCSW

## 2021-08-09 NOTE — Discharge Summary (Signed)
Physician Discharge Summary Note  Patient:  Brian Thomas is an 40 y.o., male MRN:  [id number] DOB:  18-Feb-1981 Patient phone:  [phone] (home)  Patient address:   [address] [place] [address],  Total Time spent with patient: 30 minutes  Date of Admission:  08/05/2021 Date of Discharge: 08/09/2021  Reason for Admission:  (From MD's admission note):   Principal Problem: GAD (generalized anxiety disorder) Discharge Diagnoses: Principal Problem:   GAD (generalized anxiety disorder) Active Problems:   MDD (major depressive disorder), recurrent severe, without psychosis (HCC)   Benzodiazepine dependence (HCC)   Past Psychiatric History: One previous inpatient psychiatric hospitalization at [facility] from 07/23/2006 to 07/25/2006 Ativan 2 mg (1-2 mg PO Q8H PRN for anxiety) prescribed by PCP. In addition to taking Ativan, patient states that he is also prescribed Abilify 5 mg p.o. daily by his PCP, although patient states that he has actually only been taking Abilify 2.5 mg p.o. daily (patient states that he has been taking half of 1 5 mg tablet daily).  Patient states that the Abilify has been making him feel worse mentally and he states that he does not think the Abilify is helping.  He states that he no longer wants to take the Abilify.  Patient reports that he was previously prescribed Lexapro 10 mg p.o. daily by his PCP, but patient states that he only took the medication twice, did not like the way the medication made him feel, and he states that he discontinued the medication.  He reports that he has not taken Lexapro for a few months now. Patient states that he is not prescribed any additional psychotropic medications or any additional medications at this time.  He reports that he is not taking any additional home medications at this time.  Patient states that he took Risperdal in the past "a long time ago", the patient denies history of taking any additional  psychotropic medications in the past.  Patient states that he does not have an outpatient psychiatrist or therapist at this time and he reports that he has never seen an outpatient psychiatrist or therapist.  Past Medical History:  Past Medical History:  Diagnosis Date   Pneumonia    History reviewed. No pertinent surgical history. Family History: History reviewed. No pertinent family history. Family Psychiatric  History: None reported Social History:  Social History   Substance and Sexual Activity  Alcohol Use No     Social History   Substance and Sexual Activity  Drug Use Yes   Comment: takes Delta 8 last use Friday 08/01/21    Social History   Socioeconomic History   Marital status: Divorced    Spouse name: Not on file   Number of children: 1   Years of education: Not on file   Highest education level: Not on file  Occupational History   Not on file  Tobacco Use   Smoking status: Every Day    Packs/day: 1.00    Years: 20.00    Pack years: 20.00    Types: Cigarettes   Smokeless tobacco: Never  Vaping Use   Vaping Use: Never used  Substance and Sexual Activity   Alcohol use: No   Drug use: Yes    Comment: takes Delta 8 last use Friday 08/01/21   Sexual activity: Not Currently  Other Topics Concern   Not on file  Social History Narrative   Not on file   Social Determinants of Health   Financial  Resource Strain: Not on file  Food Insecurity: Not on file  Transportation Needs: Not on file  Physical Activity: Not on file  Stress: Not on file  Social Connections: Not on file    Hospital Course:  After the above admission evaluation, Grant's presenting symptoms were noted. He was recommended for mood stabilization treatments. The medication regimen targeting those presenting symptoms were discussed with him & initiated with his consent. He was started on Zoloft 25 mg daily to target his anxiety and depression. He requested help with detox from the prescribed Ativan  2 mg three times daily he is currently on. He was started on a slow clonazepam taper and has responded well to this. He was prescribed a 30 day supply of this medication along with instructions on how to continue the taper at home. He was cautioned that abruptly stopping this medication could produce severe side effects. His UDS on arrival to the ED was positive for benzodiazepines and THC. He was medicated, stabilized & discharged on the medications as listed on his discharge medication list below. Besides the mood stabilization treatments, Lanis was also enrolled & participated in the group counseling sessions being offered & held on this unit. He learned coping skills. He presented no other significant pre-existing medical issues that required treatment. He tolerated his treatment regimen without any adverse effects or reactions reported.   During the course of his hospitalization, the 15-minute checks were adequate to ensure patient's safety. Jatinder did not display any dangerous, violent or suicidal behavior on the unit.  He interacted with patients & staff appropriately, participated appropriately in the group sessions/therapies. His medications were addressed & adjusted to meet his needs. He was recommended for outpatient follow-up care & medication management upon discharge to assure continuity of care & mood stability.  At the time of discharge patient is not reporting any acute suicidal/homicidal ideations. He feels more confident about his self-care & in managing his mental health. He currently denies any new issues or concerns. Education and supportive counseling provided throughout his hospital stay & upon discharge.   Today upon his discharge evaluation with the attending psychiatrist, Fender shares he is doing well and is ready to be discharged home and return to work. He denies any other specific concerns. He is sleeping well. His appetite is good. He denies other physical complaints. He denies  AH/VH, delusional thoughts or paranoia. He does not appear to be responding to any internal stimuli. He feels that his medications have been helpful & is in agreement to continue his current treatment regimen as recommended. He was able to engage in safety planning including plan to return to [facility] or contact emergency services if he feels unable to maintain his own safety or the safety of others. Pt had no further questions, comments, or concerns. He left [facility] with all personal belongings in no apparent distress. Transportation home per [employer].    Physical Findings: AIMS:  , ,  ,  ,    CIWA:  CIWA-Ar Total: 1 COWS:     Musculoskeletal: Strength & Muscle Tone: within normal limits Gait & Station: normal Patient leans: N/A Psychiatric Specialty Exam:  Presentation  General Appearance: Appropriate for Environment; Fairly Groomed; Casual  Eye Contact:Good  Speech:Clear and Coherent; Normal Rate  Speech Volume:Normal  Handedness:Right  Mood and Affect  Mood:Euthymic  Affect:Congruent  Thought Process  Thought Processes:Coherent; Goal Directed  Descriptions of Associations:Intact  Orientation:Full (Time, Place and Person)  Thought Content:Logical  History of Schizophrenia/Schizoaffective disorder:No  Duration of Psychotic Symptoms:N/A  Hallucinations:Hallucinations: None  Ideas of Reference:None  Suicidal Thoughts:Suicidal Thoughts: No  Homicidal Thoughts:Homicidal Thoughts: No  Sensorium  Memory:Immediate Good; Recent Good; Remote Good  Judgment:Fair  Insight:Fair  Executive Functions  Concentration:Good  Attention Span:Good  Recall:Good  Fund of Knowledge:Good  Language:Good  Psychomotor Activity  Psychomotor Activity:Psychomotor Activity: Normal (Intermittently bounces leg during evaluation)  Assets  Assets:Communication Skills; Desire for Improvement; Financial Resources/Insurance; Housing; Physical Health; Resilience; Social Support;  Vocational/Educational  Sleep  Sleep:Sleep: Good Number of Hours of Sleep: 6.75  Physical Exam: Physical Exam Vitals and nursing note reviewed.  Constitutional:      Appearance: Normal appearance.  Pulmonary:     Effort: Pulmonary effort is normal.  Musculoskeletal:        General: Normal range of motion.     Cervical back: Normal range of motion.  Neurological:     General: No focal deficit present.     Mental Status: He is alert and oriented to person, place, and time.  Psychiatric:        Attention and Perception: Attention and perception normal. He does not perceive auditory or visual hallucinations.        Mood and Affect: Mood normal.        Speech: Speech normal.        Behavior: Behavior normal. Behavior is cooperative.        Thought Content: Thought content normal. Thought content does not include suicidal ideation. Thought content does not include suicidal plan.        Cognition and Memory: Cognition normal.   Review of Systems  Constitutional: Negative.  Negative for fever.  HENT: Negative.  Negative for congestion, sinus pain and sore throat.   Respiratory: Negative.  Negative for cough and shortness of breath.   Cardiovascular: Negative.  Negative for chest pain.  Gastrointestinal: Negative.   Genitourinary: Negative.   Musculoskeletal: Negative.   Neurological: Negative.    Blood pressure 104/81, pulse 92, temperature 97.8 F (36.6 C), temperature source Oral, resp. rate 16, height 5\' 10"  (1.778 m), weight 59.9 kg, SpO2 100 %. Body mass index is 18.94 kg/m.   Social History   Tobacco Use  Smoking Status Every Day   Packs/day: 1.00   Years: 20.00   Pack years: 20.00   Types: Cigarettes  Smokeless Tobacco Never   Tobacco Cessation:  A prescription for an FDA-approved tobacco cessation medication was offered at discharge and the patient refused   Blood Alcohol level:  No results found for: [facility]  Metabolic Disorder Labs:  Lab Results  Component  Value Date   HGBA1C 4.6 (L) 08/06/2021   MPG 85.32 08/06/2021   No results found for: PROLACTIN Lab Results  Component Value Date   CHOL 169 08/06/2021   TRIG 68 08/06/2021   HDL 39 (L) 08/06/2021   CHOLHDL 4.3 08/06/2021   VLDL 14 08/06/2021   LDLCALC 116 (H) 08/06/2021    See Psychiatric Specialty Exam and Suicide Risk Assessment completed by Attending Physician prior to discharge.  Discharge destination:  Home  Is patient on multiple antipsychotic therapies at discharge:  No   Has Patient had three or more failed trials of antipsychotic monotherapy by history:  No  Recommended Plan for Multiple Antipsychotic Therapies: NA  Discharge Instructions     Diet - low sodium heart healthy   Complete by: As directed    Increase activity slowly   Complete by: As directed       Allergies as of  08/09/2021   No Known Allergies      Medication List     STOP taking these medications    ARIPiprazole 5 MG tablet Commonly known as: ABILIFY   LORazepam 2 MG tablet Commonly known as: ATIVAN       TAKE these medications      Indication  clonazePAM 0.5 MG tablet Commonly known as: KlonoPIN Take 1 tablet (0.5 mg total) by mouth 2 (two) times daily. Taper medication by 0.25 mg, or 1/2 tablet, weekly for 4 weeks.  Indication: Feeling Anxious   nicotine polacrilex 2 MG gum Commonly known as: NICORETTE Take 1 each (2 mg total) by mouth as needed for smoking cessation.  Indication: Nicotine Addiction   sertraline 25 MG tablet Commonly known as: ZOLOFT Take 1 tablet (25 mg total) by mouth daily. Start taking on: August 10, 2021  Indication: Major Depressive Disorder        Follow-up Information     [facility]. Go on 08/13/2021.   Why: You have a hospital follow up appointment for therapy and medication management services on 08/13/21 at 8:30 am.  This appointment will be held in person.  Please bring photo ID, insurance card to your  appointment. Contact information: [address] [place] [address] [phone]                 Follow-up recommendations:  Activity:  as tolerated Diet:  Heart healthy  Comments:  Prescriptions were given at discharge.  Patient is agreeable with the discharge plan. He was given an opportunity to ask questions. He appears to feel comfortable with discharge and denies any current suicidal or homicidal thoughts.   Patient is instructed prior to discharge to: Take all medications as prescribed by his mental healthcare provider. Report any adverse effects and or reactions from the medicines to his outpatient provider promptly. Patient has been instructed & cautioned: To not engage in alcohol and or illegal drug use while on prescription medicines. In the event of worsening symptoms, patient is instructed to call the crisis hotline, 911 and or go to the nearest ED for appropriate evaluation and treatment of symptoms. To follow-up with his primary care provider for your other medical issues, concerns and or health care needs.   Signed: Laveda Abbe, NP 08/09/2021, 4:53 PM

## 2021-08-09 NOTE — BHH Group Notes (Signed)
Adult Psychoeducational Group Note  Date:  08/09/2021 Time:  9:52 AM  Group Topic/Focus:  Goals Group:   The focus of this group is to help patients establish daily goals to achieve during treatment and discuss how the patient can incorporate goal setting into their daily lives to aide in recovery.  Modes of Intervention:  Activity  Additional Comments:  Pt's goals for the day are staying positive and getting back to his life at home.   Brian Thomas Khasir Woodrome 08/09/2021, 9:52 AM

## 2021-08-09 NOTE — B[facility] Suicide Risk Assessment (Signed)
[facility] Discharge Suicide Risk Assessment   Principal Problem: GAD (generalized anxiety disorder) Discharge Diagnoses: Principal Problem:   GAD (generalized anxiety disorder) Active Problems:   MDD (major depressive disorder), recurrent severe, without psychosis (HCC)   Benzodiazepine dependence (HCC)  Total Time Spent in Direct Patient Care:  I personally spent 30 minutes on the unit in direct patient care. The direct patient care time included face-to-face time with the patient, reviewing the patient's chart, communicating with other professionals, and coordinating care. Greater than 50% of this time was spent in counseling or coordinating care with the patient regarding goals of hospitalization, psycho-education, and discharge planning needs.  Subjective: Patient seen on rounds with APP. He denies SI, HI, AVH, or paranoia. He states his anxiety is manageable at this time on his present medication regimen. He voices no physical complaints and reports stable sleep and appetite. He denies medication side-effects. We discussed plans to taper off of Klonopin by reducing his dose by 0.25mg  a day every week for a slow taper using 0.5mg  tablets. He was advised not to mix Ativan with Klonopin and he agrees to dispose of un-used Ativan at a drop-box in the community. He was advised not to abruptly discontinue the Klonopin without risk of life-threatening withdrawal. He was encouraged to see his PCP to monitor his Klonopin taper until he can establish with [facility]. He can establish an aftercare and safety plan.   Musculoskeletal: Strength & Muscle Tone: within normal limits Gait & Station: normal, steady Patient leans: N/A Psychiatric Specialty Exam: Physical Exam Vitals reviewed.  HENT:     Head: Normocephalic.  Pulmonary:     Effort: Pulmonary effort is normal.  Neurological:     General: No focal deficit present.     Mental Status: He is alert.    Review of Systems  Respiratory:  Negative for  shortness of breath.   Cardiovascular:  Negative for chest pain.  Gastrointestinal:  Negative for diarrhea, nausea and vomiting.   Blood pressure 104/81, pulse 92, temperature 97.8 F (36.6 C), temperature source Oral, resp. rate 16, height 5\' 10"  (1.778 m), weight 59.9 kg, SpO2 100 %.Body mass index is 18.94 kg/m.  General Appearance:  casually dressed, adequate hygiene  Eye Contact:  Good  Speech:  Clear and Coherent and Normal Rate  Volume:  Normal  Mood:  Anxious  Affect:  Congruent  Thought Process:  Goal Directed and Linear  Orientation:  Full (Time, Place, and Person)  Thought Content:  Logical and no evidence of delusions, paranoia, or psychosis  Suicidal Thoughts:  No  Homicidal Thoughts:  No  Memory:  Recent;   Good  Judgement:  Intact  Insight:  Fair  Psychomotor Activity:  Normal  Concentration:  Concentration: Good and Attention Span: Good  Recall:  Good  Fund of Knowledge:  Good  Language:  Good  Akathisia:  Negative  Assets:  Communication Skills Desire for Improvement Housing Resilience Social Support  ADL's:  Intact  Cognition:  WNL  Sleep:  Number of Hours: 6.75   Mental Status Per Nursing Assessment::   On Admission:  anxiety - improved  Demographic Factors:  Male, Caucasian, and Living alone  Loss Factors: Loss of significant relationship and Decline in physical health  Historical Factors: Prior suicide attempts  Risk Reduction Factors:   Responsible for children under 60 years of age, Sense of responsibility to family, Employed, Positive social support, and Positive coping skills or problem solving skills  Continued Clinical Symptoms:  Previous Psychiatric Diagnoses and  Treatments GAD and depression diagnoses  Cognitive Features That Contribute To Risk:  None    Suicide Risk:  Mild:  There are no identifiable plans, no associated intent, mild anxiety and related symptoms, good self-control (both objective and subjective assessment), few  other risk factors, and identifiable protective factors, including available and accessible social support.   Follow-up Information     [facility]. Go on 08/13/2021.   Why: You have a hospital follow up appointment for therapy and medication management services on 08/13/21 at 8:30 am.  This appointment will be held in person.  Please bring photo ID, insurance card to your appointment. Contact information: [address] [place] [address] [phone]                 Plan Of Care/Follow-up recommendations:  Activity:  as tolerated Diet:  heart healthy Other:  Patient advised to follow instructions for taper off of Klonopin and to comply with Zoloft dosing. He was advised not to mix Ativan with Klonopin and to properly dispose of Ativan at [employer], ER or local pharmacy. He was advised not to abruptly stop Klonopin without risk of life-threatening withdrawal. He was advised to keep scheduled outpatient mental health follow up appointments. He is encouraged to see his primary care provider for recheck of mildly elevated LDL cholesterol as an outpatient. He is advised not to drink alcohol while on benzodiazepines.   Comer Locket, MD, FAPA 08/09/2021, 9:47 AM

## 2021-08-15 DIAGNOSIS — F330 Major depressive disorder, recurrent, mild: Secondary | ICD-10-CM | POA: Diagnosis not present

## 2021-08-15 DIAGNOSIS — R69 Illness, unspecified: Secondary | ICD-10-CM | POA: Diagnosis not present

## 2021-08-19 ENCOUNTER — Other Ambulatory Visit: Payer: Self-pay

## 2021-08-19 ENCOUNTER — Emergency Department (HOSPITAL_COMMUNITY)
Admission: EM | Admit: 2021-08-19 | Discharge: 2021-08-19 | Disposition: A | Discharge status: Home / Self Care | Payer: No Typology Code available for payment source | Attending: Emergency Medicine | Admitting: Emergency Medicine

## 2021-08-19 ENCOUNTER — Encounter (HOSPITAL_COMMUNITY): Payer: Self-pay | Admitting: Emergency Medicine

## 2021-08-19 DIAGNOSIS — Y900 Blood alcohol level of less than 20 mg/100 ml: Secondary | ICD-10-CM | POA: Diagnosis not present

## 2021-08-19 DIAGNOSIS — Z20822 Contact with and (suspected) exposure to covid-19: Secondary | ICD-10-CM | POA: Diagnosis not present

## 2021-08-19 DIAGNOSIS — R000 Tachycardia, unspecified: Secondary | ICD-10-CM | POA: Diagnosis not present

## 2021-08-19 DIAGNOSIS — F329 Major depressive disorder, single episode, unspecified: Secondary | ICD-10-CM | POA: Diagnosis not present

## 2021-08-19 DIAGNOSIS — F17200 Nicotine dependence, unspecified, uncomplicated: Secondary | ICD-10-CM | POA: Diagnosis not present

## 2021-08-19 DIAGNOSIS — F17210 Nicotine dependence, cigarettes, uncomplicated: Secondary | ICD-10-CM | POA: Insufficient documentation

## 2021-08-19 DIAGNOSIS — R69 Illness, unspecified: Secondary | ICD-10-CM | POA: Diagnosis not present

## 2021-08-19 DIAGNOSIS — F1324 Sedative, hypnotic or anxiolytic dependence with sedative, hypnotic or anxiolytic-induced mood disorder: Secondary | ICD-10-CM | POA: Diagnosis not present

## 2021-08-19 DIAGNOSIS — Z9152 Personal history of nonsuicidal self-harm: Secondary | ICD-10-CM | POA: Diagnosis not present

## 2021-08-19 DIAGNOSIS — F1290 Cannabis use, unspecified, uncomplicated: Secondary | ICD-10-CM | POA: Diagnosis not present

## 2021-08-19 DIAGNOSIS — G4700 Insomnia, unspecified: Secondary | ICD-10-CM | POA: Diagnosis not present

## 2021-08-19 DIAGNOSIS — F419 Anxiety disorder, unspecified: Secondary | ICD-10-CM | POA: Diagnosis not present

## 2021-08-19 DIAGNOSIS — Z9151 Personal history of suicidal behavior: Secondary | ICD-10-CM | POA: Diagnosis not present

## 2021-08-19 DIAGNOSIS — Z79899 Other long term (current) drug therapy: Secondary | ICD-10-CM | POA: Insufficient documentation

## 2021-08-19 DIAGNOSIS — R634 Abnormal weight loss: Secondary | ICD-10-CM | POA: Diagnosis not present

## 2021-08-19 DIAGNOSIS — F1320 Sedative, hypnotic or anxiolytic dependence, uncomplicated: Secondary | ICD-10-CM

## 2021-08-19 DIAGNOSIS — R002 Palpitations: Secondary | ICD-10-CM | POA: Diagnosis not present

## 2021-08-19 LAB — CBC WITH DIFFERENTIAL/PLATELET
Abs Immature Granulocytes: 0.08 10*3/uL — ABNORMAL HIGH (ref 0.00–0.07)
Basophils Absolute: 0.1 10*3/uL (ref 0.0–0.1)
Basophils Relative: 1 %
Eosinophils Absolute: 0.4 10*3/uL (ref 0.0–0.5)
Eosinophils Relative: 4 %
HCT: 44.1 % (ref 39.0–52.0)
Hemoglobin: 15.2 g/dL (ref 13.0–17.0)
Immature Granulocytes: 1 %
Lymphocytes Relative: 36 %
Lymphs Abs: 3.4 10*3/uL (ref 0.7–4.0)
MCH: 33 pg (ref 26.0–34.0)
MCHC: 34.5 g/dL (ref 30.0–36.0)
MCV: 95.7 fL (ref 80.0–100.0)
Monocytes Absolute: 0.9 10*3/uL (ref 0.1–1.0)
Monocytes Relative: 10 %
Neutro Abs: 4.6 10*3/uL (ref 1.7–7.7)
Neutrophils Relative %: 48 %
Platelets: 222 10*3/uL (ref 150–400)
RBC: 4.61 MIL/uL (ref 4.22–5.81)
RDW: 11.9 % (ref 11.5–15.5)
WBC: 9.4 10*3/uL (ref 4.0–10.5)
nRBC: 0 % (ref 0.0–0.2)

## 2021-08-19 LAB — SALICYLATE LEVEL: Salicylate Lvl: <7 mg/dL — ABNORMAL LOW (ref 7.0–30.0)

## 2021-08-19 LAB — RAPID URINE DRUG SCREEN, HOSP PERFORMED
Amphetamines: NOT DETECTED
Barbiturates: NOT DETECTED
Benzodiazepines: POSITIVE — AB
Cocaine: NOT DETECTED
Opiates: NOT DETECTED
Tetrahydrocannabinol: POSITIVE — AB

## 2021-08-19 LAB — COMPREHENSIVE METABOLIC PANEL
ALT: 45 U/L — ABNORMAL HIGH (ref 0–44)
AST: 26 U/L (ref 15–41)
Albumin: 4.1 g/dL (ref 3.5–5.0)
Alkaline Phosphatase: 57 U/L (ref 38–126)
Anion gap: 10 (ref 5–15)
BUN: 15 mg/dL (ref 6–20)
CO2: 29 mmol/L (ref 22–32)
Calcium: 9.4 mg/dL (ref 8.9–10.3)
Chloride: 98 mmol/L (ref 98–111)
Creatinine, Ser: 0.81 mg/dL (ref 0.61–1.24)
GFR, Estimated: >60 mL/min (ref >60)
Glucose, Bld: 90 mg/dL (ref 70–99)
Potassium: 3.3 mmol/L — ABNORMAL LOW (ref 3.5–5.1)
Sodium: 137 mmol/L (ref 135–145)
Total Bilirubin: 1 mg/dL (ref 0.3–1.2)
Total Protein: 6.8 g/dL (ref 6.5–8.1)

## 2021-08-19 LAB — ETHANOL: Alcohol, Ethyl (B): <10 mg/dL (ref <10)

## 2021-08-19 LAB — RESP PANEL BY RT-PCR (FLU A&B, COVID) ARPGX2
Influenza A by PCR: NEGATIVE
Influenza B by PCR: NEGATIVE
SARS Coronavirus 2 by RT PCR: NEGATIVE

## 2021-08-19 LAB — ACETAMINOPHEN LEVEL: Acetaminophen (Tylenol), Serum: <10 ug/mL — ABNORMAL LOW (ref 10–30)

## 2021-08-19 MED ORDER — SERTRALINE HCL 50 MG PO TABS: 25.0000 mg | ORAL_TABLET | Freq: Every day | ORAL | Status: DC

## 2021-08-19 MED ORDER — NICOTINE POLACRILEX 2 MG MT GUM
2.0000 mg | CHEWING_GUM | OROMUCOSAL | Status: DC | PRN
  Filled 2021-08-19: qty 1

## 2021-08-19 MED ORDER — CLONAZEPAM 0.5 MG PO TBDP: 0.7500 mg | ORAL_TABLET | Freq: Two times a day (BID) | ORAL | Status: DC

## 2021-08-19 NOTE — BH Assessment (Addendum)
Comprehensive Clinical Assessment (CCA) Note  08/19/2021 Brian Thomas [id number]  DISPOSITION: Per Dr. Lucianne Muss, pt is psych cleared. Recommend OP resources for OP medication management and possible rehab. RN Marylene Land advised via SecureChat and she was asked to advise the EDP.   The patient demonstrates the following risk factors for suicide: Chronic risk factors for suicide include: substance use disorder. Acute risk factors for suicide include: N/A. Protective factors for this patient include: positive social support and hope for the future. Considering these factors, the overall suicide risk at this point appears to be low. Patient is appropriate for outpatient follow up.  Flowsheet Row ED from 08/19/2021 in [facility] EMERGENCY DEPARTMENT Admission (Discharged) from [facility]  C-SSRS RISK CATEGORY No Risk Moderate Risk      Pt stated he is here "to get off of benzos." Pt denies SI, HI, AVH and paranoia. Pt denies any other substance use and stated he only has been using his prescribed medications as they are prescribed. Pt stated he has not slept at all in 3 days and reported significantly deduced appetite. Pt was discharged from an IP stay at [facility] 10 days ago for the same benzo dependence. Per pt, his PCP, Dr. Cleophus Molt is prescribing his medications and pt has not sought out a psychiatrist or psych NP/PA for med. mgmt. Pt reports being divorced with 1 daughter who attends college and lives with him on the weekends.    Chief Complaint:  Chief Complaint  Patient presents with   Detox   Visit Diagnosis:  Sedative, Hypnotic or Anxiolytic Use d/o    CCA Screening, Triage and Referral (STR)  Patient Reported Information How did you hear about [place]? Family/Friend (Pt had his brother to bring him to [facility].)  Referral name: No data recorded Referral phone number: No data recorded  Whom do you see for routine  medical problems? No data recorded Practice/Facility Name: No data recorded Practice/Facility Phone Number: No data recorded Name of Contact: No data recorded Contact Number: No data recorded Contact Fax Number: No data recorded Prescriber Name: No data recorded Prescriber Address (if known): No data recorded  What Is the Reason for Your Visit/Call Today? Pt stated he is here "to get off of benzos." Pt denies SI, HI, AVH and paranoia. Pt denies any other substance use and stated he only has been using his prescribed medications as they are prescribed. Pt stated he has not slept at all in 3 days and reported significantly deduced appetite. Pt was discharged from an IP stay at [facility] 10 days ago for the same benzo dependence. Per pt, his PCP, Dr. Cleophus Molt is prescribing his medications and pt has not sought out a psychiatrist or psych NP/PA for med. mgmt.  How Long Has This Been Causing You Problems? 1-6 months  What Do You Feel Would Help You the Most Today? Alcohol or Drug Use Treatment; Medication(s)   Have You Recently Been in Any Inpatient Treatment (Hospital/Detox/Crisis Center/28-Day Program)? No data recorded Name/Location of Program/Hospital:No data recorded How Long Were You There? No data recorded When Were You Discharged? No data recorded  Have You Ever Received Services From [facility] Before? No data recorded Who Do You See at [facility]? No data recorded  Have You Recently Had Any Thoughts About Hurting Yourself? No  Are You Planning to Commit Suicide/Harm Yourself At This time? No   Have you Recently Had Thoughts About Hurting  Someone Karolee Ohs? No  Explanation: No data recorded  Have You Used Any Alcohol or Drugs in the Past 24 Hours? No  How Long Ago Did You Use Drugs or Alcohol? No data recorded What Did You Use and How Much? No data recorded  Do You Currently Have a Therapist/Psychiatrist? No  Name of Therapist/Psychiatrist: No data recorded  Have You Been  Recently Discharged From Any Office Practice or Programs? No  Explanation of Discharge From Practice/Program: No data recorded    CCA Screening Triage Referral Assessment Type of Contact: Tele-Assessment  Is this Initial or Reassessment? Initial Assessment  Date Telepsych consult ordered in [facility]:  08/19/21  Time Telepsych consult ordered in [facility]:  0559   Patient Reported Information Reviewed? No data recorded Patient Left Without Being Seen? No data recorded Reason for Not Completing Assessment: No data recorded  Collateral Involvement: none   Does Patient Have a Court Appointed Legal Guardian? No data recorded Name and Contact of Legal Guardian: No data recorded If Minor and Not Living with Parent(s), Who has Custody? No data recorded Is CPS involved or ever been involved? -- (uta)  Is APS involved or ever been involved? -- Rich Reining)   Patient Determined To Be At Risk for Harm To Self or Others Based on Review of Patient Reported Information or Presenting Complaint? No  Method: No data recorded Availability of Means: No data recorded Intent: No data recorded Notification Required: No data recorded Additional Information for Danger to Others Potential: No data recorded Additional Comments for Danger to Others Potential: No data recorded Are There Guns or Other Weapons in Your Home? No data recorded Types of Guns/Weapons: No data recorded Are These Weapons Safely Secured?                            No data recorded Who Could Verify You Are Able To Have These Secured: No data recorded Do You Have any Outstanding Charges, Pending Court Dates, Parole/Probation? No data recorded Contacted To Inform of Risk of Harm To Self or Others: No data recorded  Location of Assessment: [facility] ED   Does Patient Present under Involuntary Commitment? No  IVC Papers Initial File Date: No data recorded  [place] of Residence: Other (Comment) (Pt's address listed as Brian [place], [place])   Patient  Currently Receiving the Following Services: Medication Management   Determination of Need: Routine (7 days)   Options For Referral: Medication Management     CCA Biopsychosocial Intake/Chief Complaint:  No data recorded Current Symptoms/Problems: No data recorded  Patient Reported Schizophrenia/Schizoaffective Diagnosis in Past: No   Strengths: Pt says "I would never hurt anybody."  Preferences: No data recorded Abilities: No data recorded  Type of Services Patient Feels are Needed: No data recorded  Initial Clinical Notes/Concerns: No data recorded  Mental Health Symptoms Depression:   Change in energy/activity; Increase/decrease in appetite; Weight gain/loss; Sleep (too much or little)   Duration of Depressive symptoms:  Greater than two weeks   Mania:   None   Anxiety:    Sleep; Tension; Worrying; Difficulty concentrating (Having panic attacks daily, last one two days ago.)   Psychosis:   None   Duration of Psychotic symptoms:  N/A   Trauma:   None   Obsessions:   None   Compulsions:   None   Inattention:   None   Hyperactivity/Impulsivity:   None   Oppositional/Defiant Behaviors:   N/A   Emotional Irregularity:  None   Other Mood/Personality Symptoms:  No data recorded   Mental Status Exam Appearance and self-care  Stature:   Average   Weight:   Thin   Clothing:   Casual   Grooming:   Normal   Cosmetic use:   None   Posture/gait:   Normal   Motor activity:   Restless; Tremor   Sensorium  Attention:   Normal   Concentration:   Anxiety interferes   Orientation:   X5   Recall/memory:   Normal   Affect and Mood  Affect:   Anxious   Mood:   Anxious   Relating  Eye contact:   Normal   Facial expression:   Anxious   Attitude toward examiner:   Cooperative   Thought and Language  Speech flow:  Clear and Coherent   Thought content:   Appropriate to Mood and Circumstances   Preoccupation:    None   Hallucinations:   None   Organization:  No data recorded  [employer] of Knowledge:   Average   Intelligence:   Average   Abstraction:   Normal   Judgement:   Fair   Dance movement psychotherapist:   Realistic   Insight:   Fair   Decision Making:   Normal   Social Functioning  Social Maturity:   Responsible   Social Judgement:   Normal   Stress  Stressors:   Work   Coping Ability:   Human resources officer Deficits:   Interpersonal   Supports:   Support needed (Medication Management and possible Drug Dependency Treatment)     Religion: Religion/Spirituality Are You A Religious Person?: Yes  Leisure/Recreation: Leisure / Recreation Do You Have Hobbies?: Yes Leisure and Hobbies: Watching TV  Exercise/Diet: Exercise/Diet Do You Exercise?: No Have You Gained or Lost A Significant Amount of Weight in the Past Six Months?: Yes-Lost Number of Pounds Lost?: 15 Do You Follow a Special Diet?: No Do You Have Any Trouble Sleeping?: Yes Explanation of Sleeping Difficulties: Pt stated he has not slept at all in 3 days.   CCA Employment/Education Employment/Work Situation: Employment / Work Situation Employment Situation: Employed Work Stressors: Working often, no time off Patient's Job has Been Impacted by Current Illness: No Has Patient ever Been in Equities trader?: No  Education: Education Last Grade Completed:  (Associatesd degree completed) Did You Attend College?: Yes What Type of College Degree Do you Have?: Associates in [employer]. Did You Have An Individualized Education Program (IIEP): No   CCA Family/Childhood History Family and Relationship History: Family history Marital status: Divorced Divorced, when?: 4 years Does patient have children?: Yes How many children?: 1 (in college. Lives with pt on the weekends) How is patient's relationship with their children?: "We get along well"  Childhood History:   Childhood History By whom was/is the patient raised?: Both parents Description of patient's current relationship with siblings: "We get along really well" Did patient suffer any verbal/emotional/physical/sexual abuse as a child?: No Did patient suffer from severe childhood neglect?: No Has patient ever been sexually abused/assaulted/raped as an adolescent or adult?: No Was the patient ever a victim of a crime or a disaster?: No Witnessed domestic violence?: No Has patient been affected by domestic violence as an adult?: No Description of domestic violence: Has witnessed it as an adult.  Child/Adolescent Assessment:     CCA Substance Use Alcohol/Drug Use: Alcohol / Drug Use Pain Medications: None Prescriptions: Ativan, Abilify Over the Counter: see MAR History  of alcohol / drug use?: No history of alcohol / drug abuse Longest period of sobriety (when/how long): unknown Withdrawal Symptoms: Tremors                         ASAM's:  Six Dimensions of Multidimensional Assessment  Dimension 1:  Acute Intoxication and/or Withdrawal Potential:      Dimension 2:  Biomedical Conditions and Complications:      Dimension 3:  Emotional, Behavioral, or Cognitive Conditions and Complications:     Dimension 4:  Readiness to Change:     Dimension 5:  Relapse, Continued use, or Continued Problem Potential:     Dimension 6:  Recovery/Living Environment:     ASAM Severity Score:    ASAM Recommended Level of Treatment:     Substance use Disorder (SUD)    Recommendations for Services/Supports/Treatments:    DSM5 Diagnoses: Patient Active Problem List   Diagnosis Date Noted   MDD (major depressive disorder), recurrent severe, without psychosis (HCC) 08/05/2021   GAD (generalized anxiety disorder) 08/05/2021   Benzodiazepine dependence (HCC) 08/05/2021    Patient Centered Plan: Patient is on the following Treatment Plan(s):  Anxiety   Referrals to Alternative  Service(s): Referred to Alternative Service(s):   Place:   Date:   Time:    Referred to Alternative Service(s):   Place:   Date:   Time:    Referred to Alternative Service(s):   Place:   Date:   Time:    Referred to Alternative Service(s):   Place:   Date:   Time:     Liyanna Cartwright T, Counselor

## 2021-08-19 NOTE — Discharge Instructions (Signed)
You appear to be doing well with the current detoxification protocol.  This is a hard thing to do.  Work on sleep hygiene, including avoiding stimulation for a few hours before bedtime.  This includes ingestion of caffeine, healthy eating, and screen time.  Follow-up with your PCP for further discussion and treatment of the difficulty sleeping.  He can also assist you with time off work if needed.

## 2021-08-19 NOTE — ED Provider Notes (Signed)
[facility] EMERGENCY DEPARTMENT Provider Note   CSN: [id number] Arrival date & time: 08/19/21  0458     History Chief Complaint  Patient presents with   Detox    Brian Thomas is a 40 y.o. male.  40 year old male presents to the emergency department requesting assistance with benzodiazepine detox.  Was discharged from [facility] 10 days ago after admission for same.  Was transitioned from Ativan to Klonopin.  Given instructions for a 4-week taper.  This was augmented by his primary care doctor; Klonopin dosage was increased and taper was extended to 6 weeks.  He reports that he continues to feel jittery with palpitations.  He has had insomnia with inability to sleep x3 days.  States that he is feeling unwell, generally.  He has no suicidal or homicidal thoughts, no auditory or visual hallucinations.  The history is provided by the patient. No language interpreter was used.      Past Medical History:  Diagnosis Date   Pneumonia     Patient Active Problem List   Diagnosis Date Noted   MDD (major depressive disorder), recurrent severe, without psychosis (HCC) 08/05/2021   GAD (generalized anxiety disorder) 08/05/2021   Benzodiazepine dependence (HCC) 08/05/2021    History reviewed. No pertinent surgical history.     No family history on file.  Social History   Tobacco Use   Smoking status: Every Day    Packs/day: 1.00    Years: 20.00    Pack years: 20.00    Types: Cigarettes   Smokeless tobacco: Never  Vaping Use   Vaping Use: Never used  Substance Use Topics   Alcohol use: No   Drug use: Yes    Comment: takes Delta 8 last use Friday 08/01/21    Home Medications Prior to Admission medications   Medication Sig Start Date End Date Taking? Authorizing Provider  clonazePAM (KLONOPIN) 0.5 MG tablet Take 1 tablet (0.5 mg total) by mouth 2 (two) times daily. Taper medication by 0.25 mg, or 1/2 tablet, weekly for 4 weeks. 08/09/21 08/09/22  Laveda Abbe, NP  nicotine polacrilex (NICORETTE) 2 MG gum Take 1 each (2 mg total) by mouth as needed for smoking cessation. 08/09/21   Laveda Abbe, NP  sertraline (ZOLOFT) 25 MG tablet Take 1 tablet (25 mg total) by mouth daily. 08/10/21   Laveda Abbe, NP    Allergies    Patient has no known allergies.  Review of Systems   Review of Systems Ten systems reviewed and are negative for acute change, except as noted in the HPI.    Physical Exam Updated Vital Signs BP 135/89 (BP Location: Right Arm)   Pulse 94   Temp 98.1 F (36.7 C) (Oral)   Resp 18   SpO2 94%   Physical Exam Vitals and nursing note reviewed.  Constitutional:      General: He is not in acute distress.    Appearance: He is well-developed. He is not diaphoretic.  HENT:     Head: Normocephalic and atraumatic.  Eyes:     General: No scleral icterus.    Conjunctiva/sclera: Conjunctivae normal.  Pulmonary:     Effort: Pulmonary effort is normal. No respiratory distress.  Musculoskeletal:        General: Normal range of motion.     Cervical back: Normal range of motion.  Skin:    General: Skin is warm and dry.     Coloration: Skin is not pale.  Findings: No erythema or rash.  Neurological:     Mental Status: He is alert and oriented to person, place, and time.  Psychiatric:        Attention and Perception: He does not perceive auditory or visual hallucinations.        Mood and Affect: Mood is anxious.        Behavior: Behavior is withdrawn. Behavior is cooperative.        Thought Content: Thought content does not include homicidal or suicidal ideation.    ED Results / Procedures / Treatments   Labs (all labs ordered are listed, but only abnormal results are displayed) Labs Reviewed  RESP PANEL BY RT-PCR (FLU A&B, COVID) ARPGX2  CBC WITH DIFFERENTIAL/PLATELET  COMPREHENSIVE METABOLIC PANEL  ACETAMINOPHEN LEVEL  SALICYLATE LEVEL  RAPID URINE DRUG SCREEN, HOSP PERFORMED  ETHANOL     EKG None  Radiology No results found.  Procedures Procedures   Medications Ordered in ED Medications  nicotine polacrilex (NICORETTE) gum 2 mg (has no administration in time range)  clonazePAM (KLONOPIN) disintegrating tablet 0.75 mg (has no administration in time range)    ED Course  I have reviewed the triage vital signs and the nursing notes.  Pertinent labs & imaging results that were available during my care of the patient were reviewed by me and considered in my medical decision making (see chart for details).    MDM Rules/Calculators/A&P                           40 year old male presents to the emergency department requesting assistance with taper off benzodiazepines.  Was discharged from [facility] 10 days ago after admission for a similar request.  No SI/HI.  Pending TTS assessment to assist in disposition.  Care signed out to oncoming ED provider.   Final Clinical Impression(s) / ED Diagnoses Final diagnoses:  Benzodiazepine dependence [facility])    Rx / DC Orders ED Discharge Orders     None        Antony Madura, PA-C 08/19/21 0557    Shon Baton, MD 08/19/21 [phone]

## 2021-08-19 NOTE — BH Assessment (Signed)
Clinician attempted to make contact with pt's team via internal messenger at [phone] but there were then no providers attached to him in which to contact. It also appears pt is currently in the waiting room. TTS to attempt at a later time.

## 2021-08-19 NOTE — ED Notes (Signed)
TTS set up for pt 

## 2021-08-19 NOTE — ED Triage Notes (Addendum)
Patient requesting Detox from benzodiazepine addiction , denies SI or HI , no hallucinations . Patient added insomnia for several days .

## 2021-08-19 NOTE — ED Provider Notes (Signed)
Face-to-face evaluation   History: He presents for evaluation of difficulty sleeping while on benzodiazepine taper.  He was recently discharged from the psychiatric hospital, behavioral health, on a clonazepam taper initially 8.5 mg twice daily.  He saw his PCP, several days ago, who changed his taper to 0.75 twice daily for 1 week.  He has filled prescriptions for clonazepam, 0.25 mg, #56 on 08/09/2021, and #126 on 08/15/2021.  He denies nausea, vomiting, weakness or dizziness.  He is having trouble working his job because of sleepiness.  He denies systemic symptoms such as fever, chills, cough or shortness of breath.  He is not having any chest pain.  He has a history of chronic benzodiazepine use for, general anxiety disorder.  There are no other known active modifying factors.  Physical exam: Alert, nontoxic appearance.  No dysarthria or aphasia.  No respiratory distress.  Normal movement, arms and legs bilaterally.   Patient Vitals for the past 24 hrs:  BP Temp Temp src Pulse Resp SpO2 Height Weight  08/19/21 0845 120/81 -- -- 95 16 98 % -- --  08/19/21 0830 106/73 -- -- 89 -- 97 % -- --  08/19/21 0815 110/74 -- -- 82 -- 98 % -- --  08/19/21 0802 112/86 -- -- 78 17 98 % 5\' 10"  (1.778 m) 60.3 kg  08/19/21 0511 135/89 98.1 F (36.7 C) Oral 94 18 94 % -- --    9:57 AM Reevaluation with update and discussion. After initial assessment and treatment, an updated evaluation reveals he is calm comfortable.  Findings discussed with the patient.  He declined a work note.  All questions answered. 08/21/21   Medical Decision Making:  This patient is presenting for evaluation of benzodiazepine dependence, which does require a range of treatment options, and is a complaint that involves a moderate risk of morbidity and mortality. The differential diagnoses include medication noncompliance, benzodiazepine dependence. I decided to review old records, and in summary millage adult presenting for  complications of outpatient benzodiazepine detoxification protocol.  I did not require additional historical information from anyone.  Clinical Laboratory Tests Ordered, included CBC, Metabolic panel, and alcohol level, toxic ingestion levels, UDS, viral panel . Review indicates normal except UDS with benzodiazepines and THC, potassium low.  Clinical Course as of 08/19/21 0957  Tue Aug 19, 2021  Aug 21, 2021 TTS note reviewed.  He was psychiatrically cleared for outpatient management as needed. [EW]  9629 Patient is adequately covered for management by his PCP. [EW]    Clinical Course User Index [EW] [id number], MD     Critical Interventions-clinical evaluation, laboratory testing, TTS consultation, observation and reassessment  After These Interventions, the Patient was reevaluated and was found stable for discharge.  Patient with primary complaint of insomnia during benzodiazepine taper.  This is expected and appears to be uncomplicated.  There is no indication for changing the current treatment plan.  Patient indication for hospitalization or psychiatric interventions at this time.  CRITICAL CARE-no Performed by: Mancel Bale  Nursing Notes Reviewed/ Care Coordinated Applicable Imaging Reviewed Interpretation of Laboratory Data incorporated into ED treatment  The patient appears reasonably screened and/or stabilized for discharge and I doubt any other medical condition or other [facility] requiring further screening, evaluation, or treatment in the ED at this time prior to discharge.  Plan: Home Medications-continue current plan; Home Treatments-rest, fluids, sleep hygiene; return here if the recommended treatment, does not improve the symptoms; Recommended follow up-PCP, as needed.  Psychiatric services as needed.  Medical screening examination/treatment/procedure(s) were conducted as a shared visit with non-physician practitioner(s) and myself.  I personally evaluated the patient  during the encounter    Mancel Bale, MD 08/19/21 [phone]

## 2021-08-25 ENCOUNTER — Emergency Department (HOSPITAL_COMMUNITY): Payer: 59

## 2021-08-25 ENCOUNTER — Emergency Department (HOSPITAL_COMMUNITY)
Admission: EM | Admit: 2021-08-25 | Discharge: 2021-08-26 | Disposition: A | Discharge status: Home / Self Care | Payer: 59 | Attending: Emergency Medicine | Admitting: Emergency Medicine

## 2021-08-25 ENCOUNTER — Other Ambulatory Visit: Payer: Self-pay

## 2021-08-25 ENCOUNTER — Encounter (HOSPITAL_COMMUNITY): Payer: Self-pay | Admitting: Emergency Medicine

## 2021-08-25 DIAGNOSIS — R072 Precordial pain: Secondary | ICD-10-CM | POA: Insufficient documentation

## 2021-08-25 DIAGNOSIS — R1111 Vomiting without nausea: Secondary | ICD-10-CM | POA: Diagnosis not present

## 2021-08-25 DIAGNOSIS — R0789 Other chest pain: Secondary | ICD-10-CM

## 2021-08-25 DIAGNOSIS — R079 Chest pain, unspecified: Secondary | ICD-10-CM | POA: Diagnosis not present

## 2021-08-25 DIAGNOSIS — R002 Palpitations: Secondary | ICD-10-CM | POA: Diagnosis not present

## 2021-08-25 DIAGNOSIS — F17200 Nicotine dependence, unspecified, uncomplicated: Secondary | ICD-10-CM | POA: Diagnosis not present

## 2021-08-25 DIAGNOSIS — F329 Major depressive disorder, single episode, unspecified: Secondary | ICD-10-CM | POA: Diagnosis not present

## 2021-08-25 DIAGNOSIS — F17210 Nicotine dependence, cigarettes, uncomplicated: Secondary | ICD-10-CM | POA: Diagnosis not present

## 2021-08-25 DIAGNOSIS — D72829 Elevated white blood cell count, unspecified: Secondary | ICD-10-CM | POA: Diagnosis not present

## 2021-08-25 DIAGNOSIS — F419 Anxiety disorder, unspecified: Secondary | ICD-10-CM | POA: Diagnosis not present

## 2021-08-25 DIAGNOSIS — Z9152 Personal history of nonsuicidal self-harm: Secondary | ICD-10-CM | POA: Diagnosis not present

## 2021-08-25 DIAGNOSIS — G4700 Insomnia, unspecified: Secondary | ICD-10-CM | POA: Diagnosis not present

## 2021-08-25 DIAGNOSIS — R000 Tachycardia, unspecified: Secondary | ICD-10-CM | POA: Diagnosis not present

## 2021-08-25 DIAGNOSIS — R112 Nausea with vomiting, unspecified: Secondary | ICD-10-CM | POA: Diagnosis not present

## 2021-08-25 DIAGNOSIS — R634 Abnormal weight loss: Secondary | ICD-10-CM | POA: Diagnosis not present

## 2021-08-25 DIAGNOSIS — R110 Nausea: Secondary | ICD-10-CM | POA: Diagnosis not present

## 2021-08-25 DIAGNOSIS — Z79899 Other long term (current) drug therapy: Secondary | ICD-10-CM | POA: Insufficient documentation

## 2021-08-25 DIAGNOSIS — Z9151 Personal history of suicidal behavior: Secondary | ICD-10-CM | POA: Diagnosis not present

## 2021-08-25 DIAGNOSIS — R69 Illness, unspecified: Secondary | ICD-10-CM | POA: Diagnosis not present

## 2021-08-25 LAB — COMPREHENSIVE METABOLIC PANEL
ALT: 26 U/L (ref 0–44)
AST: 19 U/L (ref 15–41)
Albumin: 4.1 g/dL (ref 3.5–5.0)
Alkaline Phosphatase: 53 U/L (ref 38–126)
Anion gap: 10 (ref 5–15)
BUN: 9 mg/dL (ref 6–20)
CO2: 26 mmol/L (ref 22–32)
Calcium: 9.2 mg/dL (ref 8.9–10.3)
Chloride: 102 mmol/L (ref 98–111)
Creatinine, Ser: 0.67 mg/dL (ref 0.61–1.24)
GFR, Estimated: >60 mL/min (ref >60)
Glucose, Bld: 136 mg/dL — ABNORMAL HIGH (ref 70–99)
Potassium: 3.3 mmol/L — ABNORMAL LOW (ref 3.5–5.1)
Sodium: 138 mmol/L (ref 135–145)
Total Bilirubin: 0.5 mg/dL (ref 0.3–1.2)
Total Protein: 6.7 g/dL (ref 6.5–8.1)

## 2021-08-25 LAB — CBG MONITORING, ED: Glucose-Capillary: 128 mg/dL — ABNORMAL HIGH (ref 70–99)

## 2021-08-25 LAB — CBC
HCT: 43 % (ref 39.0–52.0)
Hemoglobin: 14.9 g/dL (ref 13.0–17.0)
MCH: 33.1 pg (ref 26.0–34.0)
MCHC: 34.7 g/dL (ref 30.0–36.0)
MCV: 95.6 fL (ref 80.0–100.0)
Platelets: 244 10*3/uL (ref 150–400)
RBC: 4.5 MIL/uL (ref 4.22–5.81)
RDW: 11.8 % (ref 11.5–15.5)
WBC: 15.3 10*3/uL — ABNORMAL HIGH (ref 4.0–10.5)
nRBC: 0 % (ref 0.0–0.2)

## 2021-08-25 LAB — TROPONIN I (HIGH SENSITIVITY)
Troponin I (High Sensitivity): 4 ng/L (ref <18)
Troponin I (High Sensitivity): 5 ng/L (ref <18)

## 2021-08-25 LAB — LIPASE, BLOOD: Lipase: 26 U/L (ref 11–51)

## 2021-08-25 NOTE — ED Notes (Signed)
Pt reported going to the bathroom and felt a sudden onset of dizziness and feeling like he was going to pass out. Pt is clammy, slow to respond to questions but answering appropriately. Repeat EKG completed

## 2021-08-25 NOTE — ED Triage Notes (Signed)
Pt BIB [facility] from [facility], c/o intermittent left sided chest pain x 3 days. Describes the pain as "feeling like someone is sitting on my chest". [facility], today's EKG had changes from the one done yesterday. Denies pain at this time. Given 324mg  asa pta.

## 2021-08-25 NOTE — ED Provider Notes (Signed)
Emergency Medicine Provider Triage Evaluation Note  Brian Thomas , a 40 y.o. male  was evaluated in triage.  Pt complains of intermittent chest pain.  Patient currently at [facility] for benzodiazepine detox.  Patient had an episode of chest pain today started at rest.  He had an EKG done which showed changes.  No vomiting, diaphoresis, abdominal pain.  He has a smoker.  No history of hypertension, diabetes, high cholesterol.  Father died last year at age 42 "of a heart attack".  Review of Systems  Positive: Chest pain Negative: Vomiting  Physical Exam  There were no vitals taken for this visit. Gen:   Awake, no distress   Resp:  Normal effort  MSK:   Moves extremities without difficulty  Other:    Medical Decision Making  Medically screening exam initiated at 6:56 PM.  Appropriate orders placed.  Luman Holway was informed that the remainder of the evaluation will be completed by another provider, this initial triage assessment does not replace that evaluation, and the importance of remaining in the ED until their evaluation is complete.  EKG sent with patient from 10/3 compared to 9/28 shows T wave flattening inferiorly today compared to previous.        Kailo, Kos[place], PA-C 08/25/21 1858    Milagros Loll, MD 08/26/21 [phone]

## 2021-08-25 NOTE — ED Notes (Signed)
Patient stepped outside  

## 2021-08-25 NOTE — ED Notes (Signed)
Patient returned inside

## 2021-08-26 DIAGNOSIS — R000 Tachycardia, unspecified: Secondary | ICD-10-CM | POA: Diagnosis not present

## 2021-08-26 DIAGNOSIS — G4700 Insomnia, unspecified: Secondary | ICD-10-CM | POA: Diagnosis not present

## 2021-08-26 DIAGNOSIS — R69 Illness, unspecified: Secondary | ICD-10-CM | POA: Diagnosis not present

## 2021-08-26 DIAGNOSIS — F17200 Nicotine dependence, unspecified, uncomplicated: Secondary | ICD-10-CM | POA: Diagnosis not present

## 2021-08-26 DIAGNOSIS — F419 Anxiety disorder, unspecified: Secondary | ICD-10-CM | POA: Diagnosis not present

## 2021-08-26 DIAGNOSIS — F329 Major depressive disorder, single episode, unspecified: Secondary | ICD-10-CM | POA: Diagnosis not present

## 2021-08-26 DIAGNOSIS — Z9152 Personal history of nonsuicidal self-harm: Secondary | ICD-10-CM | POA: Diagnosis not present

## 2021-08-26 DIAGNOSIS — R002 Palpitations: Secondary | ICD-10-CM | POA: Diagnosis not present

## 2021-08-26 DIAGNOSIS — Z9151 Personal history of suicidal behavior: Secondary | ICD-10-CM | POA: Diagnosis not present

## 2021-08-26 DIAGNOSIS — R634 Abnormal weight loss: Secondary | ICD-10-CM | POA: Diagnosis not present

## 2021-08-26 NOTE — ED Provider Notes (Signed)
[facility] EMERGENCY DEPARTMENT Provider Note   CSN: [id number] Arrival date & time: 08/25/21  1850     History Chief Complaint  Patient presents with   Chest Pain    Aldric Wenzler is a 40 y.o. male.  40  y.o male with a PMH of MDD, GAD, benzo dependence presents to the ED with a chief complaint of chest pain x yesterday.Patient is currently residing at [facility], currently detoxing from Ativan.  He reports use of 2 mg twice daily for the past 2 years.  States episode lasted about 10 minutes, felt a sudden sensation of chest pressure to the substernal and left side.  No medication taken for improvement in his symptoms.  In addition, he began to feel palpitations.  He does state this usually occurs when he is detoxing, although it has never happened in the past.  Feels that is likely related to his detox.  No prior history of CAD, does have family history of CAD with his dad dying of an MI at the age of 6.  He endorses tobacco use with smoking about a pack a day.  No prior history of blood clots, no shortness of breath, no leg swelling.      The history is provided by the patient and medical records.  Chest Pain Pain location:  Substernal area and L chest Pain quality: pressure   Pain radiates to:  Does not radiate Pain severity:  Moderate Onset quality:  Sudden Duration:  10 minutes Timing:  Constant Progression:  Resolved Chronicity:  Recurrent Context: not drug use   Relieved by:  Nothing Worsened by:  Nothing Associated symptoms: palpitations   Associated symptoms: no abdominal pain, no back pain, no fever, no headache, no nausea, no shortness of breath and no vomiting       Past Medical History:  Diagnosis Date   Pneumonia     Patient Active Problem List   Diagnosis Date Noted   MDD (major depressive disorder), recurrent severe, without psychosis (HCC) 08/05/2021   GAD (generalized anxiety disorder) 08/05/2021   Benzodiazepine dependence  (HCC) 08/05/2021    History reviewed. No pertinent surgical history.     No family history on file.  Social History   Tobacco Use   Smoking status: Every Day    Packs/day: 1.00    Years: 20.00    Pack years: 20.00    Types: Cigarettes   Smokeless tobacco: Never  Vaping Use   Vaping Use: Never used  Substance Use Topics   Alcohol use: No   Drug use: Yes    Comment: takes Delta 8 last use Friday 08/01/21    Home Medications Prior to Admission medications   Medication Sig Start Date End Date Taking? Authorizing Provider  clonazePAM (KLONOPIN) 0.25 MG disintegrating tablet 0.25-0.75 mg See admin instructions. Week One: Take 0.75mg  sublingual twice daily. Week Two: Take 0.75mg  in the morning and 0.5mg  in the evening. Week Three: Take 0.5mg  twice daily. Week Four: Take 0.5mg  in the morning and 0.25mg  in the evening. Week Five: Take 0.25 twice daily. Week Six: Take 0.25mg  daily. Then stop. 08/15/21   [provider]  clonazePAM (KLONOPIN) 0.5 MG tablet Take 1 tablet (0.5 mg total) by mouth 2 (two) times daily. Taper medication by 0.25 mg, or 1/2 tablet, weekly for 4 weeks. Patient not taking: Reported on 08/19/2021 08/09/21 08/09/22  Laveda Abbe, NP  ibuprofen (ADVIL) 200 MG tablet Take 200 mg by mouth every 6 (six) hours as  needed.    [provider]  MELATONIN PO Take 1 tablet by mouth at bedtime.    [provider]  Multiple Vitamin (MULTIVITAMIN WITH MINERALS) TABS tablet Take 1 tablet by mouth daily.    [provider]  nicotine polacrilex (NICORETTE) 2 MG gum Take 1 each (2 mg total) by mouth as needed for smoking cessation. Patient not taking: No sig reported 08/09/21   Laveda Abbe, NP  sertraline (ZOLOFT) 25 MG tablet Take 1 tablet (25 mg total) by mouth daily. Patient not taking: No sig reported 08/10/21   Laveda Abbe, NP  traZODone (DESYREL) 50 MG tablet Take 50 mg by mouth at bedtime. 08/18/21   [provider]    Allergies    Patient has no known allergies.  Review of Systems   Review of Systems  Constitutional:  Negative for chills and fever.  HENT:  Negative for sore throat.   Respiratory:  Negative for shortness of breath.   Cardiovascular:  Positive for chest pain and palpitations. Negative for leg swelling.  Gastrointestinal:  Negative for abdominal pain, nausea and vomiting.  Genitourinary:  Negative for difficulty urinating and flank pain.  Musculoskeletal:  Negative for back pain.  Neurological:  Negative for light-headedness and headaches.  All other systems reviewed and are negative.  Physical Exam Updated Vital Signs BP (!) 123/97   Pulse 99   Temp 98.6 F (37 C) (Oral)   Resp 15   Ht 5\' 10"  (1.778 m)   Wt 60.3 kg   SpO2 98%   BMI 19.07 kg/m   Physical Exam Vitals and nursing note reviewed.  Constitutional:      Appearance: He is well-developed.  HENT:     Head: Normocephalic and atraumatic.  Cardiovascular:     Rate and Rhythm: Normal rate.  Pulmonary:     Effort: Pulmonary effort is normal.     Breath sounds: Normal breath sounds. No decreased breath sounds.  Chest:     Chest wall: No tenderness.  Musculoskeletal:     Cervical back: Normal range of motion and neck supple.     Right lower leg: No tenderness. No edema.     Left lower leg: No tenderness. No edema.  Skin:    General: Skin is warm and dry.  Neurological:     Mental Status: He is alert and oriented to person, place, and time.    ED Results / Procedures / Treatments   Labs (all labs ordered are listed, but only abnormal results are displayed) Labs Reviewed  CBC - Abnormal; Notable for the following components:      Result Value   WBC 15.3 (*)    All other components within normal limits  COMPREHENSIVE METABOLIC PANEL - Abnormal; Notable for the following components:   Potassium 3.3 (*)    Glucose, Bld 136 (*)    All other components within normal limits  CBG MONITORING,  ED - Abnormal; Notable for the following components:   Glucose-Capillary 128 (*)    All other components within normal limits  LIPASE, BLOOD  TROPONIN I (HIGH SENSITIVITY)  TROPONIN I (HIGH SENSITIVITY)    EKG EKG Interpretation  Date/Time:  Monday August 25 2021 22:42:44 EDT Ventricular Rate:  93 PR Interval:  120 QRS Duration: 88 QT Interval:  348 QTC Calculation: 432 R Axis:   77 Text Interpretation: Normal sinus rhythm with sinus arrhythmia Normal ECG Since last tracing rate slower Confirmed by [id number] [phone]) on 08/26/2021 10:23:40  AM  Radiology DG Chest 2 View  Result Date: 08/25/2021 CLINICAL DATA:  Intermittent chest pain EXAM: CHEST - 2 VIEW COMPARISON:  None. FINDINGS: Normal mediastinum and cardiac silhouette. Normal pulmonary vasculature. No evidence of effusion, infiltrate, or pneumothorax. No acute bony abnormality. IMPRESSION: No acute cardiopulmonary process. Electronically Signed   By: Genevive Bi M.D.   On: 08/25/2021 20:00    Procedures Procedures   Medications Ordered in ED Medications - No data to display  ED Course  I have reviewed the triage vital signs and the nursing notes.  Pertinent labs & imaging results that were available during my care of the patient were reviewed by me and considered in my medical decision making (see chart for details).    MDM Rules/Calculators/A&P    Patient presents to the ED with a chief complaint of chest pain sudden onset yesterday while he was hypoxia Renae Fickle.  He is currently detoxing from Ativan after daily use for the past 2 years.  Reports prior tree of smoking, however no prior history of cardiac disease.  No prior cardiology work-up, does not have any stress test on file.  Episode lasted about 10 minutes, resolved without any intervention.  He does have a strong family history of CAD with his dad having an MI at the age of 89.  Denies any shortness of breath today, no prior history of blood  clots.  Neurology is overall non-ill appearing, nontoxic.  Lungs are clear to auscultation without any obvious wheezing or rales.  Abdomen is soft nontender to palpation.  He does not have any urinary symptoms on today's visit.  Moves all upper and lower extremities.  Does report feeling restless from not sleeping last night as he was evaluated by me 15 hours since his arrival to the ED.  Interpretation of his blood work which was obtained yesterday revealed a CBC with a leukocytosis of 15.3, no signs of infection, no URI, we will send a COVID-19 test.  CMP without any electrolyte derangement aside from some slight hypokalemia.  Current levels within normal limits.  LFTs unremarkable.  Lipase level is normal.  Troponin x2 have remained flat.  Without any signs of pneumonia or pleural effusion.  HEART SCORE 3   EKG was noted to have some changes, however this was later reviewed with my attending Dr. Particia Nearing.  He is asymptomatic with flat troponin, lower suspicion for ACS. Suspect symptoms are coming from his withdraws. He denies any hx of alcohol abuse, non tremulous on my exam. Patient is stale for discharge in stable condition.   Portions of this note were generated with Scientist, clinical (histocompatibility and immunogenetics). Dictation errors may occur despite best attempts at proofreading.  Final Clinical Impression(s) / ED Diagnoses Final diagnoses:  Atypical chest pain    Rx / DC Orders ED Discharge Orders     None        Claude Manges, PA-C 08/26/21 1042    Jacalyn Lefevre, MD 08/26/21 1105

## 2021-08-26 NOTE — Discharge Instructions (Addendum)
Your laboratory results are within normal limits today.  You will need to continue to follow-up with your primary care physician as needed.  If you experience any worsening symptoms you may return to the emergency department.

## 2021-08-26 NOTE — ED Notes (Signed)
Report given to [facility], they stated they would pick up pt

## 2021-08-26 NOTE — ED Notes (Signed)
Patient stated he is hallucinating, and detoxing from drugs. Triage RN aware.

## 2021-08-26 NOTE — ED Notes (Signed)
Patient given water/snack when offered 

## 2021-08-27 DIAGNOSIS — F17200 Nicotine dependence, unspecified, uncomplicated: Secondary | ICD-10-CM | POA: Diagnosis not present

## 2021-08-27 DIAGNOSIS — R634 Abnormal weight loss: Secondary | ICD-10-CM | POA: Diagnosis not present

## 2021-08-27 DIAGNOSIS — G4700 Insomnia, unspecified: Secondary | ICD-10-CM | POA: Diagnosis not present

## 2021-08-27 DIAGNOSIS — F329 Major depressive disorder, single episode, unspecified: Secondary | ICD-10-CM | POA: Diagnosis not present

## 2021-08-27 DIAGNOSIS — Z9152 Personal history of nonsuicidal self-harm: Secondary | ICD-10-CM | POA: Diagnosis not present

## 2021-08-27 DIAGNOSIS — Z9151 Personal history of suicidal behavior: Secondary | ICD-10-CM | POA: Diagnosis not present

## 2021-08-27 DIAGNOSIS — R69 Illness, unspecified: Secondary | ICD-10-CM | POA: Diagnosis not present

## 2021-08-27 DIAGNOSIS — R002 Palpitations: Secondary | ICD-10-CM | POA: Diagnosis not present

## 2021-08-27 DIAGNOSIS — R000 Tachycardia, unspecified: Secondary | ICD-10-CM | POA: Diagnosis not present

## 2021-08-27 DIAGNOSIS — F419 Anxiety disorder, unspecified: Secondary | ICD-10-CM | POA: Diagnosis not present

## 2021-08-28 DIAGNOSIS — G4700 Insomnia, unspecified: Secondary | ICD-10-CM | POA: Diagnosis not present

## 2021-08-28 DIAGNOSIS — Z9151 Personal history of suicidal behavior: Secondary | ICD-10-CM | POA: Diagnosis not present

## 2021-08-28 DIAGNOSIS — R002 Palpitations: Secondary | ICD-10-CM | POA: Diagnosis not present

## 2021-08-28 DIAGNOSIS — Z9152 Personal history of nonsuicidal self-harm: Secondary | ICD-10-CM | POA: Diagnosis not present

## 2021-08-28 DIAGNOSIS — F17200 Nicotine dependence, unspecified, uncomplicated: Secondary | ICD-10-CM | POA: Diagnosis not present

## 2021-08-28 DIAGNOSIS — F329 Major depressive disorder, single episode, unspecified: Secondary | ICD-10-CM | POA: Diagnosis not present

## 2021-08-28 DIAGNOSIS — R69 Illness, unspecified: Secondary | ICD-10-CM | POA: Diagnosis not present

## 2021-08-28 DIAGNOSIS — R634 Abnormal weight loss: Secondary | ICD-10-CM | POA: Diagnosis not present

## 2021-08-28 DIAGNOSIS — F419 Anxiety disorder, unspecified: Secondary | ICD-10-CM | POA: Diagnosis not present

## 2021-08-28 DIAGNOSIS — R000 Tachycardia, unspecified: Secondary | ICD-10-CM | POA: Diagnosis not present

## 2021-08-29 ENCOUNTER — Emergency Department (HOSPITAL_COMMUNITY)
Admission: EM | Admit: 2021-08-29 | Discharge: 2021-08-29 | Disposition: A | Discharge status: Home / Self Care | Payer: 59 | Attending: Emergency Medicine | Admitting: Emergency Medicine

## 2021-08-29 ENCOUNTER — Other Ambulatory Visit: Payer: Self-pay

## 2021-08-29 ENCOUNTER — Ambulatory Visit (HOSPITAL_COMMUNITY)
Admission: RE | Admit: 2021-08-29 | Discharge: 2021-08-29 | Disposition: A | Discharge status: Home / Self Care | Payer: 59 | Source: Home / Self Care | Attending: Psychiatry | Admitting: Psychiatry

## 2021-08-29 ENCOUNTER — Encounter (HOSPITAL_COMMUNITY): Payer: Self-pay | Admitting: Emergency Medicine

## 2021-08-29 ENCOUNTER — Emergency Department (EMERGENCY_DEPARTMENT_HOSPITAL)
Admission: EM | Admit: 2021-08-29 | Discharge: 2021-08-30 | Disposition: A | Discharge status: Other Acute Inpatient Hospital | Payer: 59 | Source: Home / Self Care

## 2021-08-29 ENCOUNTER — Encounter (HOSPITAL_COMMUNITY): Payer: Self-pay

## 2021-08-29 DIAGNOSIS — Z20822 Contact with and (suspected) exposure to covid-19: Secondary | ICD-10-CM | POA: Insufficient documentation

## 2021-08-29 DIAGNOSIS — R000 Tachycardia, unspecified: Secondary | ICD-10-CM | POA: Diagnosis not present

## 2021-08-29 DIAGNOSIS — F419 Anxiety disorder, unspecified: Secondary | ICD-10-CM

## 2021-08-29 DIAGNOSIS — F411 Generalized anxiety disorder: Secondary | ICD-10-CM | POA: Insufficient documentation

## 2021-08-29 DIAGNOSIS — F1320 Sedative, hypnotic or anxiolytic dependence, uncomplicated: Secondary | ICD-10-CM | POA: Insufficient documentation

## 2021-08-29 DIAGNOSIS — G4700 Insomnia, unspecified: Secondary | ICD-10-CM | POA: Insufficient documentation

## 2021-08-29 DIAGNOSIS — Z560 Unemployment, unspecified: Secondary | ICD-10-CM | POA: Diagnosis not present

## 2021-08-29 DIAGNOSIS — R440 Auditory hallucinations: Secondary | ICD-10-CM | POA: Insufficient documentation

## 2021-08-29 DIAGNOSIS — Z9151 Personal history of suicidal behavior: Secondary | ICD-10-CM | POA: Insufficient documentation

## 2021-08-29 DIAGNOSIS — F332 Major depressive disorder, recurrent severe without psychotic features: Secondary | ICD-10-CM | POA: Insufficient documentation

## 2021-08-29 DIAGNOSIS — R45851 Suicidal ideations: Secondary | ICD-10-CM | POA: Insufficient documentation

## 2021-08-29 DIAGNOSIS — F17210 Nicotine dependence, cigarettes, uncomplicated: Secondary | ICD-10-CM | POA: Insufficient documentation

## 2021-08-29 DIAGNOSIS — F329 Major depressive disorder, single episode, unspecified: Secondary | ICD-10-CM | POA: Diagnosis not present

## 2021-08-29 DIAGNOSIS — R69 Illness, unspecified: Secondary | ICD-10-CM | POA: Diagnosis not present

## 2021-08-29 MED ORDER — HYDROXYZINE HCL 25 MG PO TABS
25.0000 mg | ORAL_TABLET | Freq: Three times a day (TID) | ORAL | Status: DC | PRN
  Administered 2021-08-30: 25 mg via ORAL
  Filled 2021-08-29: qty 1

## 2021-08-29 MED ORDER — QUETIAPINE FUMARATE 25 MG PO TABS: 25.0000 mg | ORAL_TABLET | Freq: Every day | ORAL | 0 refills | Status: DC

## 2021-08-29 MED ORDER — QUETIAPINE FUMARATE 50 MG PO TABS
50.0000 mg | ORAL_TABLET | Freq: Every day | ORAL | Status: DC
  Administered 2021-08-30: 50 mg via ORAL
  Filled 2021-08-29: qty 1

## 2021-08-29 NOTE — ED Triage Notes (Signed)
Pt arrived via POV, c/o anxiety. No SI no hi.

## 2021-08-29 NOTE — ED Triage Notes (Addendum)
Patient here from [facility] reporting anxiety with insomnia. Reports that he no longer takes benzos, rehab for detox "sometime ago". Denies SI/HI/AVH. Seen earlier today for same.

## 2021-08-29 NOTE — ED Provider Notes (Signed)
[facility]-EMERGENCY DEPT Provider Note   CSN: [id number] Arrival date & time: 08/29/21  1008     History No chief complaint on file.   Brian Thomas is a 40 y.o. male.  HPI Patient presents with concern of anxiousness, no pain.  His history is notable for depression, anxiety, benzodiazepine dependency.  He was just discharged from a local rehabilitation facility after receiving therapy for benzodiazepine dependency.  Last dose of Librium was today.  He notes no history of seizures.  He has no interest in resuming benzodiazepines for his depression.  He similarly is not interested in SSRI.  He presents today to have evaluation due to concern of of his anxiousness, and insomnia secondary to anxiety.  Specific response to questions about suicidal thought result in him stating" I would never kill myself".  He does acknowledge that with his history of depression he has had fleeting thoughts intermittently, but states" they are never something that I would act upon".  He also denies homicidal ideation.    Past Medical History:  Diagnosis Date   Pneumonia     Patient Active Problem List   Diagnosis Date Noted   MDD (major depressive disorder), recurrent severe, without psychosis (HCC) 08/05/2021   GAD (generalized anxiety disorder) 08/05/2021   Benzodiazepine dependence (HCC) 08/05/2021    No past surgical history on file.     No family history on file.  Social History   Tobacco Use   Smoking status: Every Day    Packs/day: 1.00    Years: 20.00    Pack years: 20.00    Types: Cigarettes   Smokeless tobacco: Never  Vaping Use   Vaping Use: Never used  Substance Use Topics   Alcohol use: No   Drug use: Yes    Comment: takes Delta 8 last use Friday 08/01/21    Home Medications Prior to Admission medications   Medication Sig Start Date End Date Taking? Authorizing Provider  QUEtiapine (SEROQUEL) 25 MG tablet Take 1 tablet (25 mg total) by mouth at  bedtime. 08/29/21 09/28/21 Yes Gerhard Munch, MD  clonazePAM (KLONOPIN) 0.25 MG disintegrating tablet 0.25-0.75 mg See admin instructions. Week One: Take 0.75mg  sublingual twice daily. Week Two: Take 0.75mg  in the morning and 0.5mg  in the evening. Week Three: Take 0.5mg  twice daily. Week Four: Take 0.5mg  in the morning and 0.25mg  in the evening. Week Five: Take 0.25 twice daily. Week Six: Take 0.25mg  daily. Then stop. 08/15/21   [provider]  clonazePAM (KLONOPIN) 0.5 MG tablet Take 1 tablet (0.5 mg total) by mouth 2 (two) times daily. Taper medication by 0.25 mg, or 1/2 tablet, weekly for 4 weeks. Patient not taking: Reported on 08/19/2021 08/09/21 08/09/22  Laveda Abbe, NP  ibuprofen (ADVIL) 200 MG tablet Take 200 mg by mouth every 6 (six) hours as needed.    [provider]  MELATONIN PO Take 1 tablet by mouth at bedtime.    [provider]  Multiple Vitamin (MULTIVITAMIN WITH MINERALS) TABS tablet Take 1 tablet by mouth daily.    [provider]  nicotine polacrilex (NICORETTE) 2 MG gum Take 1 each (2 mg total) by mouth as needed for smoking cessation. Patient not taking: No sig reported 08/09/21   Laveda Abbe, NP  sertraline (ZOLOFT) 25 MG tablet Take 1 tablet (25 mg total) by mouth daily. Patient not taking: No sig reported 08/10/21   Laveda Abbe, NP  traZODone (DESYREL) 50 MG tablet Take 50 mg by  mouth at bedtime. 08/18/21   [provider]    Allergies    Patient has no known allergies.  Review of Systems   Review of Systems  Constitutional:        Per HPI, otherwise negative  HENT:         Per HPI, otherwise negative  Respiratory:         Per HPI, otherwise negative  Cardiovascular:        Per HPI, otherwise negative  Gastrointestinal:  Negative for vomiting.  Endocrine:       Negative aside from HPI  Genitourinary:        Neg aside from HPI   Musculoskeletal:        Per HPI, otherwise negative   Skin: Negative.   Neurological:  Negative for syncope.  Psychiatric/Behavioral:  Positive for sleep disturbance. Negative for suicidal ideas. The patient is nervous/anxious.    Physical Exam Updated Vital Signs BP (!) 136/93 (BP Location: Left Arm)   Pulse 99   Temp 98 F (36.7 C) (Oral)   Resp 16   SpO2 95%   Physical Exam Vitals and nursing note reviewed.  Constitutional:      General: He is not in acute distress.    Appearance: He is well-developed.  HENT:     Head: Normocephalic and atraumatic.  Eyes:     Conjunctiva/sclera: Conjunctivae normal.  Pulmonary:     Effort: Pulmonary effort is normal. No respiratory distress.     Breath sounds: No stridor.  Abdominal:     General: There is no distension.  Skin:    General: Skin is warm and dry.  Neurological:     Mental Status: He is alert and oriented to person, place, and time.  Psychiatric:        Mood and Affect: Mood is anxious.        Thought Content: Thought content is not delusional. Thought content does not include homicidal or suicidal ideation. Thought content does not include homicidal or suicidal plan.        Cognition and Memory: Cognition is not impaired. Memory is not impaired.    ED Results / Procedures / Treatments    Procedures Procedures  Patient I discussed his rationale for evaluation today, goals.  We discussed options including behavioral health referral.  Viral evaluation today, or initiation of Seroquel, per his request for assistance with anxiety and sleeping.  He notes that he has had previous success with this medication, is capable of following up as an outpatient to discuss titration of dosage, and refills.  Here, with no suicidal ideation, no delusions, no distress, no hemodynamic stability, no pain, patient is appropriate for close outpatient follow-up with our behavioral health urgent care center after initiation of Seroquel therapy.  Final Clinical Impression(s) / ED Diagnoses Final  diagnoses:  Anxiousness    Rx / DC Orders ED Discharge Orders          Ordered    QUEtiapine (SEROQUEL) 25 MG tablet  Daily at bedtime        08/29/21 1101             Gerhard Munch, MD 08/29/21 1105

## 2021-08-29 NOTE — BH Assessment (Signed)
Comprehensive Clinical Assessment (CCA) Note  08/29/2021 Brian Thomas [id number]  DISPOSITION: Completed CCA accompanied by Brian Bering, NP who completed MSE and determined Pt meets criteria for inpatient psychiatric treatment. Brian Thomas, [name], confirmed adult unit is at capacity. Pt will be transferred to [name] for observation and placement.  The patient demonstrates the following risk factors for suicide: Chronic risk factors for suicide include: psychiatric disorder of GAD and MDD, substance use disorder, and previous suicide attempts by overdose . Acute risk factors for suicide include: recent discharge from inpatient psychiatry. Protective factors for this patient include: positive social support and responsibility to others (children, family). Considering these factors, the overall suicide risk at this point appears to be low. Patient is appropriate for outpatient follow up.  Flowsheet Row ED from 08/29/2021 in [facility] Most recent reading at 08/29/2021 11:07 PM ED from 08/29/2021 in [facility] Most recent reading at 08/29/2021 11:43 AM ED from 08/25/2021 in [facility] EMERGENCY DEPARTMENT Most recent reading at 08/25/2021  6:59 PM  C-SSRS RISK CATEGORY No Risk No Risk No Risk      Pt is a 40 year old divorced male who presents unaccompanied to [name] reporting symptoms of depression, insomnia, and severe anxiety. He says his primary care physician prescribed him Ativan daily for two years and he took the medication as prescribed and became addicted. He states he received treatment at [name] and was discharged today because he had an abnormal EKG and their program was not designed to treat his anxiety and depression. He says he has been detoxed from benzodiazepines and never wants to take them again. He was referred to [name] ED due to the abnormal EKG and was  medically cleared and discharged with recommendations for outpatient treatment. He reports he was prescribed Seroquel but cannot sleep. He reports he is impaired due to sleep depravation and it was dangerous for him to drive to [name] but he needs help.  Pt describes severe anxiety, crying spells, social withdrawal, decreased appetite and feelings of hopelessness. He says he has lost 10-15 pounds. He states he has not slept in days. Pt's concentration is impaired and he repeats himself several times. He says when he goes without sleep for 4-5 days he begins hallucinating. He denies current auditory of visual hallucinations. He reports recurring thoughts that he is going to die. He denies any plan or intent to kill himself. Pt reports one previous suicide attempt in 2007 by overdosing on pills. He denied current homicidal ideation or history of violence. He denies alcohol or other substance use, stating he always took Ativan as prescribed.  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 Pt says he cannot work if he cannot sleep and repeatedly asks for a non-benzodiazepine medication that will allow him to sleep. He says if he loses his job he runs the risk of losing "everything." He says he has an 27-year-old daughter who he cares for on weekends.  Pt was inpatient at [name]. He says he was put on a long tamper with Klonopin to detox from benzodiazepines and describes the process as "psychological torture" because his anxiety was so severe. He checked into [name] and says they did a faster detox which he considers successful but now he cannot sleep and has severe anxiety.  Pt is casually alert  and oriented x4. Pt speaks in a clear tone, at moderate volume and normal pace. Motor behavior appears tremulous. Eye contact is good. Pt's mood is very anxious and depressed, affect is congruent with mood. Thought process is coherent  and relevant. There is no indication Pt is currently responding to internal stimuli or experiencing delusional thought content. Pt was cooperative throughout assessment. Pt is requesting inpatient psychiatric treatment.   Chief Complaint:  Chief Complaint  Patient presents with   Psychiatric Evaluation   Visit Diagnosis: F41.1 Generalized anxiety disorder F33.2 Major depressive disorder, Recurrent episode, Severe   CCA Screening, Triage and Referral (STR)  Patient Reported Information How did you hear about [place]? Self  Referral name: No data recorded Referral phone number: No data recorded  Whom do you see for routine medical problems? No data recorded Practice/Facility Name: No data recorded Practice/Facility Phone Number: No data recorded Name of Contact: No data recorded Contact Number: No data recorded Contact Fax Number: No data recorded Prescriber Name: No data recorded Prescriber Address (if known): No data recorded  What Is the Reason for Your Visit/Call Today? Pt reports he was discharged from [facility] today after being detoxed from benzodiazepines. He says he has insomnia and severe anxiety. Pt reports recurring thoughts that he is going to die. He says he does not feel safe to return home.  How Long Has This Been Causing You Problems? > than 6 months  What Do You Feel Would Help You the Most Today? Treatment for Depression or other mood problem; Medication(s)   Have You Recently Been in Any Inpatient Treatment (Hospital/Detox/Crisis Center/28-Day Program)? No data recorded Name/Location of Program/Hospital:No data recorded How Long Were You There? No data recorded When Were You Discharged? No data recorded  Have You Ever Received Services From [facility] Before? No data recorded Who Do You See at [facility]? No data recorded  Have You Recently Had Any Thoughts About Hurting Yourself? Yes  Are You Planning to Commit Suicide/Harm Yourself At This time?  No   Have you Recently Had Thoughts About Hurting Someone Karolee Ohs? No  Explanation: No data recorded  Have You Used Any Alcohol or Drugs in the Past 24 Hours? No  How Long Ago Did You Use Drugs or Alcohol? No data recorded What Did You Use and How Much? No data recorded  Do You Currently Have a Therapist/Psychiatrist? No  Name of Therapist/Psychiatrist: No data recorded  Have You Been Recently Discharged From Any Office Practice or Programs? Yes  Explanation of Discharge From Practice/Program: Discharged from [facility] today.     CCA Screening Triage Referral Assessment Type of Contact: Face-to-Face  Is this Initial or Reassessment? Initial Assessment  Date Telepsych consult ordered in [facility]:  08/19/21  Time Telepsych consult ordered in [facility]:  0559   Patient Reported Information Reviewed? No data recorded Patient Left Without Being Seen? No data recorded Reason for Not Completing Assessment: No data recorded  Collateral Involvement: Medical record   Does Patient Have a Court Appointed Legal Guardian? No data recorded Name and Contact of Legal Guardian: No data recorded If Minor and Not Living with Parent(s), Who has Custody? NA  Is CPS involved or ever been involved? Never  Is APS involved or ever been involved? Never   Patient Determined To Be At Risk for Harm To Self or Others Based on Review of Patient Reported Information or Presenting Complaint? No  Method: No data recorded Availability of Means: No data recorded Intent: No data  recorded Notification Required: No data recorded Additional Information for Danger to Others Potential: No data recorded Additional Comments for Danger to Others Potential: No data recorded Are There Guns or Other Weapons in Your Home? No data recorded Types of Guns/Weapons: No data recorded Are These Weapons Safely Secured?                            No data recorded Who Could Verify You Are Able To Have These Secured: No data  recorded Do You Have any Outstanding Charges, Pending Court Dates, Parole/Probation? No data recorded Contacted To Inform of Risk of Harm To Self or Others: -- (NA)   Location of Assessment: [facility]   Does Patient Present under Involuntary Commitment? No  IVC Papers Initial File Date: No data recorded  [place] of Residence: [place]   Patient Currently Receiving the Following Services: Not Receiving Services   Determination of Need: Urgent (48 hours)   Options For Referral: Inpatient Hospitalization; Medication Management; Outpatient Therapy     CCA Biopsychosocial Intake/Chief Complaint:  No data recorded Current Symptoms/Problems: No data recorded  Patient Reported Schizophrenia/Schizoaffective Diagnosis in Past: No   Strengths: Pt is motivated for treatment.  Preferences: No data recorded Abilities: No data recorded  Type of Services Patient Feels are Needed: No data recorded  Initial Clinical Notes/Concerns: No data recorded  Mental Health Symptoms Depression:   Change in energy/activity; Difficulty Concentrating; Fatigue; Hopelessness; Increase/decrease in appetite; Sleep (too much or little); Tearfulness   Duration of Depressive symptoms:  Greater than two weeks   Mania:   None   Anxiety:    Difficulty concentrating; Fatigue; Restlessness; Sleep; Tension; Worrying   Psychosis:   None   Duration of Psychotic symptoms:  N/A   Trauma:   None   Obsessions:   None   Compulsions:   None   Inattention:   None   Hyperactivity/Impulsivity:   None   Oppositional/Defiant Behaviors:   N/A   Emotional Irregularity:   None   Other Mood/Personality Symptoms:   None    Mental Status Exam Appearance and self-care  Stature:   Average   Weight:   Thin   Clothing:   Casual   Grooming:   Normal   Cosmetic use:   None   Posture/gait:   Normal   Motor activity:   Restless; Tremor   Sensorium  Attention:    Normal   Concentration:   Anxiety interferes   Orientation:   X5   Recall/memory:   Normal   Affect and Mood  Affect:   Anxious   Mood:   Anxious   Relating  Eye contact:   Normal   Facial expression:   Anxious   Attitude toward examiner:   Cooperative   Thought and Language  Speech flow:  Normal   Thought content:   Appropriate to Mood and Circumstances   Preoccupation:   None   Hallucinations:   None   Organization:  No data recorded  [employer] of Knowledge:   Average   Intelligence:   Average   Abstraction:   Normal   Judgement:   Fair   Reality Testing:   Adequate   Insight:   Gaps   Decision Making:   Normal   Social Functioning  Social Maturity:   Responsible   Social Judgement:   Normal   Stress  Stressors:   Work   Coping Ability:   Exhausted;  Overwhelmed   Skill Deficits:   Interpersonal   Supports:   Support needed     Religion: Religion/Spirituality Are You A Religious Person?: Yes How Might This Affect Treatment?: NA  Leisure/Recreation: Leisure / Recreation Do You Have Hobbies?: Yes Leisure and Hobbies: Watching TV  Exercise/Diet: Exercise/Diet Do You Exercise?: No Have You Gained or Lost A Significant Amount of Weight in the Past Six Months?: Yes-Lost Number of Pounds Lost?: 15 Do You Follow a Special Diet?: No Do You Have Any Trouble Sleeping?: Yes Explanation of Sleeping Difficulties: Pt stated he has not slept at all in 3 days.   CCA Employment/Education Employment/Work Situation: Employment / Work Situation Employment Situation: Employed Work Stressors: Working often, no time off Patient's Job has Been Impacted by Current Illness: No Has Patient ever Been in Equities trader?: No  Education: Education Is Patient Currently Attending School?: No Did You Product manager?: Yes What Type of College Degree Do you Have?: Associates in [employer]. Did You Have  An Individualized Education Program (IIEP): No Did You Have Any Difficulty At School?: No Patient's Education Has Been Impacted by Current Illness: No   CCA Family/Childhood History Family and Relationship History: Family history Marital status: Divorced Divorced, when?: 4 years Does patient have children?: Yes How many children?: 1 How is patient's relationship with their children?: 39 year old daughter. "We get along well"  Childhood History:  Childhood History By whom was/is the patient raised?: Both parents Description of patient's current relationship with siblings: "We get along really well" Did patient suffer any verbal/emotional/physical/sexual abuse as a child?: No Did patient suffer from severe childhood neglect?: No Has patient ever been sexually abused/assaulted/raped as an adolescent or adult?: No Was the patient ever a victim of a crime or a disaster?: No Witnessed domestic violence?: No Has patient been affected by domestic violence as an adult?: No Description of domestic violence: Has witnessed it as an adult.  Child/Adolescent Assessment:     CCA Substance Use Alcohol/Drug Use: Alcohol / Drug Use Pain Medications: None Prescriptions: Seroquel Over the Counter: see MAR History of alcohol / drug use?: Yes Longest period of sobriety (when/how long): unknown Withdrawal Symptoms: Tremors Substance #1 Name of Substance 1: Ativan 1 - Age of First Use: 38 1 - Amount (size/oz): 6 mg 1 - Frequency: daily 1 - Duration: 2 years 1 - Last Use / Amount: 2 weeks ago 1 - Method of Aquiring: Primary care physician 1- Route of Use: Oral ingestion                       ASAM's:  Six Dimensions of Multidimensional Assessment  Dimension 1:  Acute Intoxication and/or Withdrawal Potential:      Dimension 2:  Biomedical Conditions and Complications:      Dimension 3:  Emotional, Behavioral, or Cognitive Conditions and Complications:     Dimension 4:   Readiness to Change:     Dimension 5:  Relapse, Continued use, or Continued Problem Potential:     Dimension 6:  Recovery/Living Environment:     ASAM Severity Score:    ASAM Recommended Level of Treatment:     Substance use Disorder (SUD)    Recommendations for Services/Supports/Treatments:    DSM5 Diagnoses: Patient Active Problem List   Diagnosis Date Noted   MDD (major depressive disorder), recurrent severe, without psychosis (HCC) 08/05/2021   GAD (generalized anxiety disorder) 08/05/2021   Benzodiazepine dependence (HCC) 08/05/2021    Patient Centered Plan:  Patient is on the following Treatment Plan(s):  Anxiety and Depression   Referrals to Alternative Service(s): Referred to Alternative Service(s):   Place:   Date:   Time:    Referred to Alternative Service(s):   Place:   Date:   Time:    Referred to Alternative Service(s):   Place:   Date:   Time:    Referred to Alternative Service(s):   Place:   Date:   Time:     Pamalee Leyden, [facility]

## 2021-08-29 NOTE — H&P (Signed)
Behavioral Health Medical Screening Exam Diagnosis: GAD (generalized anxiety disorder)  MDD major depressive disorder, recurrent severe w/o psychosis Benzodiazepine dependence   Brian Thomas is a 40 y.o. male with a history of GAD, MDD and benzodiazepine dependency presents by himself voluntarily at the [facility] for increased anxiety symptoms and inability to sleep. Patient presents with clear, coherent speech and logical thought process, but appears very nervous and anxious with visible restlessness during the evaluation. Patient reports that he was prescribed xanax 2mg  bid by his PCP at [facility] and was taking it as prescribed for 2 years. Patient stated that he attempted to stop taking the xanax in the September of this year but experienced withdrawals symptoms. Patient was admitted to [facility] on 08/05/21 and  was discharged on a clonazepam 0.5 mg  taper and sertraline 25 mg. Patient reports that the sertraline gave him tremors and he has tried other ssris in the past with no success. Patient cannot recall the names of the previously tried medications. Patient presented again to 08/07/21 ED on 08/19/21 with similar complaints and was evaluated and referred to outpatient resources. Patient received inpatient treatment at [facility] for about a week and was discharged from that facility today after detoxing off of clonazepam. Patient is emphatic that he does not ever want to be described a benzodiazepine. Patient reports that he is not experiencing any withdrawal symptoms, but his inability to sleep has worsened his anxiety symptoms and interfered with his ability to return back to work. Patient reports that he has not been able to work and he is now endanger of losing his job and without any income he will be in jeopardy of losing his home. Patient has been feeling hopeless, worthless, with some suicidal ideations but no plan or intent, crying spells, poor appetite, weight loss of 10lbs in  the last few months and self-isolating.  Patient reports poor sleep, slept 3 hours last night but it was not restful. Patient reports a suicide attempt by overdose in 2007 but none since that time.Patient denies any alcohol use or any other substance use. Patient reports the seroquel 25mg  was somewhat helpful, but he wants to be able to sleep through the night.     Total Time spent with patient: 30 minutes  Psychiatric Specialty Exam:  Presentation  General Appearance: Appropriate for Environment; Casual  Eye Contact:Good  Speech:Clear and Coherent  Speech Volume:Normal  Handedness:Right   Mood and Affect  Mood:Hopeless; Anxious  Affect:Blunt   Thought Process  Thought Processes:Coherent  Descriptions of Associations:Intact  Orientation:Full (Time, Place and Person)  Thought Content:Logical; WDL  History of Schizophrenia/Schizoaffective disorder:No  Duration of Psychotic Symptoms:N/A  Hallucinations:Hallucinations: None  Ideas of Reference:None  Suicidal Thoughts:Suicidal Thoughts: Yes, Passive SI Passive Intent and/or Plan: Without Intent; Without Plan  Homicidal Thoughts:Homicidal Thoughts: No   Sensorium  Memory:Immediate Good; Recent Good  Judgment:Fair  Insight:Fair   Executive Functions  Concentration:Good  Attention Span:Good  Recall:Good  Fund of Knowledge:Good  Language:Good   Psychomotor Activity  Psychomotor Activity:Psychomotor Activity: Normal   Assets  Assets:Communication Skills; Desire for Improvement; Physical Health; Resilience; Financial Resources/Insurance; Housing   Sleep  Sleep:Sleep: Poor Number of Hours of Sleep: -1    Physical Exam: Physical Exam HENT:     Head: Normocephalic and atraumatic.     Nose: Nose normal.     Mouth/Throat:     Mouth: Mucous membranes are moist.  Eyes:     Pupils: Pupils are equal, round, and  reactive to light.  Cardiovascular:     Rate and Rhythm: Tachycardia present.   Pulmonary:     Effort: Pulmonary effort is normal.  Abdominal:     General: Abdomen is flat.  Musculoskeletal:        General: Normal range of motion.     Cervical back: Normal range of motion.  Skin:    General: Skin is warm.  Neurological:     General: No focal deficit present.     Mental Status: He is alert.  Psychiatric:        Attention and Perception: Attention normal.        Mood and Affect: Mood is anxious. Affect is flat.        Speech: Speech normal.        Behavior: Behavior normal.        Thought Content: Thought content normal.        Cognition and Memory: Cognition normal.        Judgment: Judgment normal.   Review of Systems  Constitutional: Negative.   HENT: Negative.    Respiratory: Negative.    Cardiovascular: Negative.   Gastrointestinal: Negative.   Genitourinary: Negative.   Skin: Negative.   Neurological: Negative.   Endo/Heme/Allergies: Negative.   Psychiatric/Behavioral:  Positive for depression and suicidal ideas. The patient is nervous/anxious and has insomnia.   Blood pressure (!) 142/89, pulse (!) 106, temperature 99.1 F (37.3 C), resp. rate 18, SpO2 99 %. There is no height or weight on file to calculate BMI.  Musculoskeletal: Strength & Muscle Tone: within normal limits Gait & Station: normal Patient leans: N/A   Recommendations:  Based on my evaluation patient does not appear to have an emergency medical condition. Based on patient's current presentation, patient is experiencing an exacerbation of anxiety and depression symptoms, which include worsening suicidal ideation, inability to sleep and feeling hopeless. Recommend inpatient psychiatric treatment for patient for crisis stabilization.There are no available bed at [facility] and patient will be transferred to [facility] ED at this time until and an appropriate bed is available. Patient is in agreement with this plan and is willing to go voluntarily to [facility] ED.  Jasper Riling, NP 08/30/2021, 6:40 AM

## 2021-08-29 NOTE — Discharge Instructions (Signed)
As discussed, you are starting a medication, Seroquel, that should be discussed with your mental health team at follow-up next week.  Please take it as directed.  Do not hesitate to return here or to behavioral health urgent care should you develop new, or concerning changes in your condition.

## 2021-08-30 ENCOUNTER — Other Ambulatory Visit: Payer: Self-pay

## 2021-08-30 ENCOUNTER — Inpatient Hospital Stay (HOSPITAL_COMMUNITY)
Admission: AD | Admit: 2021-08-30 | Discharge: 2021-08-31 | DRG: 880 | Disposition: A | Discharge status: Home / Self Care | Payer: No Typology Code available for payment source | Source: Intra-hospital | Attending: Emergency Medicine | Admitting: Emergency Medicine

## 2021-08-30 ENCOUNTER — Other Ambulatory Visit: Payer: Self-pay | Admitting: Psychiatry

## 2021-08-30 ENCOUNTER — Encounter (HOSPITAL_COMMUNITY): Payer: Self-pay | Admitting: Student

## 2021-08-30 ENCOUNTER — Encounter (HOSPITAL_COMMUNITY): Payer: Self-pay | Admitting: Nurse Practitioner

## 2021-08-30 DIAGNOSIS — R69 Illness, unspecified: Secondary | ICD-10-CM | POA: Diagnosis not present

## 2021-08-30 DIAGNOSIS — G4700 Insomnia, unspecified: Secondary | ICD-10-CM | POA: Diagnosis present

## 2021-08-30 DIAGNOSIS — F17210 Nicotine dependence, cigarettes, uncomplicated: Secondary | ICD-10-CM | POA: Diagnosis present

## 2021-08-30 DIAGNOSIS — F1320 Sedative, hypnotic or anxiolytic dependence, uncomplicated: Secondary | ICD-10-CM | POA: Diagnosis not present

## 2021-08-30 DIAGNOSIS — Z560 Unemployment, unspecified: Secondary | ICD-10-CM | POA: Diagnosis not present

## 2021-08-30 DIAGNOSIS — G2581 Restless legs syndrome: Secondary | ICD-10-CM | POA: Insufficient documentation

## 2021-08-30 DIAGNOSIS — Z20822 Contact with and (suspected) exposure to covid-19: Secondary | ICD-10-CM | POA: Diagnosis present

## 2021-08-30 DIAGNOSIS — F411 Generalized anxiety disorder: Secondary | ICD-10-CM | POA: Diagnosis present

## 2021-08-30 DIAGNOSIS — R45851 Suicidal ideations: Secondary | ICD-10-CM | POA: Diagnosis not present

## 2021-08-30 DIAGNOSIS — F332 Major depressive disorder, recurrent severe without psychotic features: Secondary | ICD-10-CM | POA: Diagnosis not present

## 2021-08-30 DIAGNOSIS — R440 Auditory hallucinations: Secondary | ICD-10-CM | POA: Diagnosis not present

## 2021-08-30 DIAGNOSIS — R000 Tachycardia, unspecified: Secondary | ICD-10-CM | POA: Diagnosis not present

## 2021-08-30 LAB — RESP PANEL BY RT-PCR (FLU A&B, COVID) ARPGX2
Influenza A by PCR: NEGATIVE
Influenza B by PCR: NEGATIVE
SARS Coronavirus 2 by RT PCR: NEGATIVE

## 2021-08-30 LAB — CBC WITH DIFFERENTIAL/PLATELET
Abs Immature Granulocytes: 0.06 10*3/uL (ref 0.00–0.07)
Basophils Absolute: 0.1 10*3/uL (ref 0.0–0.1)
Basophils Relative: 1 %
Eosinophils Absolute: 0.6 10*3/uL — ABNORMAL HIGH (ref 0.0–0.5)
Eosinophils Relative: 5 %
HCT: 45.5 % (ref 39.0–52.0)
Hemoglobin: 15.6 g/dL (ref 13.0–17.0)
Immature Granulocytes: 1 %
Lymphocytes Relative: 32 %
Lymphs Abs: 4 10*3/uL (ref 0.7–4.0)
MCH: 32.7 pg (ref 26.0–34.0)
MCHC: 34.3 g/dL (ref 30.0–36.0)
MCV: 95.4 fL (ref 80.0–100.0)
Monocytes Absolute: 1.2 10*3/uL — ABNORMAL HIGH (ref 0.1–1.0)
Monocytes Relative: 10 %
Neutro Abs: 6.3 10*3/uL (ref 1.7–7.7)
Neutrophils Relative %: 51 %
Platelets: 256 10*3/uL (ref 150–400)
RBC: 4.77 MIL/uL (ref 4.22–5.81)
RDW: 11.9 % (ref 11.5–15.5)
WBC: 12.2 10*3/uL — ABNORMAL HIGH (ref 4.0–10.5)
nRBC: 0 % (ref 0.0–0.2)

## 2021-08-30 LAB — COMPREHENSIVE METABOLIC PANEL
ALT: 33 U/L (ref 0–44)
AST: 20 U/L (ref 15–41)
Albumin: 4.5 g/dL (ref 3.5–5.0)
Alkaline Phosphatase: 66 U/L (ref 38–126)
Anion gap: 8 (ref 5–15)
BUN: 15 mg/dL (ref 6–20)
CO2: 33 mmol/L — ABNORMAL HIGH (ref 22–32)
Calcium: 9.6 mg/dL (ref 8.9–10.3)
Chloride: 99 mmol/L (ref 98–111)
Creatinine, Ser: 0.74 mg/dL (ref 0.61–1.24)
GFR, Estimated: >60 mL/min (ref >60)
Glucose, Bld: 118 mg/dL — ABNORMAL HIGH (ref 70–99)
Potassium: 3.3 mmol/L — ABNORMAL LOW (ref 3.5–5.1)
Sodium: 140 mmol/L (ref 135–145)
Total Bilirubin: 0.4 mg/dL (ref 0.3–1.2)
Total Protein: 7.5 g/dL (ref 6.5–8.1)

## 2021-08-30 LAB — RAPID URINE DRUG SCREEN, HOSP PERFORMED
Amphetamines: NOT DETECTED
Barbiturates: NOT DETECTED
Benzodiazepines: POSITIVE — AB
Cocaine: NOT DETECTED
Opiates: NOT DETECTED
Tetrahydrocannabinol: NOT DETECTED

## 2021-08-30 LAB — ACETAMINOPHEN LEVEL: Acetaminophen (Tylenol), Serum: <10 ug/mL — ABNORMAL LOW (ref 10–30)

## 2021-08-30 LAB — SALICYLATE LEVEL: Salicylate Lvl: <7 mg/dL — ABNORMAL LOW (ref 7.0–30.0)

## 2021-08-30 LAB — ETHANOL: Alcohol, Ethyl (B): <10 mg/dL (ref <10)

## 2021-08-30 MED ORDER — NICOTINE 21 MG/24HR TD PT24
21.0000 mg | MEDICATED_PATCH | Freq: Every day | TRANSDERMAL | Status: DC
  Administered 2021-08-30: 21 mg via TRANSDERMAL
  Filled 2021-08-30: qty 1

## 2021-08-30 MED ORDER — ALUM & MAG HYDROXIDE-SIMETH 200-200-20 MG/5ML PO SUSP
30.0000 mL | ORAL | Status: DC | PRN
Start: 2021-08-30 — End: 2021-08-31

## 2021-08-30 MED ORDER — POTASSIUM CHLORIDE 20 MEQ PO PACK
40.0000 meq | PACK | Freq: Once | ORAL | Status: DC
  Filled 2021-08-30: qty 2

## 2021-08-30 MED ORDER — RENA-VITE PO TABS
1.0000 | ORAL_TABLET | Freq: Every day | ORAL | Status: DC
  Administered 2021-08-30: 1 via ORAL
  Filled 2021-08-30 (×3): qty 1

## 2021-08-30 MED ORDER — POTASSIUM CHLORIDE CRYS ER 20 MEQ PO TBCR
40.0000 meq | EXTENDED_RELEASE_TABLET | Freq: Once | ORAL | Status: AC
  Administered 2021-08-30: 40 meq via ORAL
  Filled 2021-08-30: qty 2

## 2021-08-30 MED ORDER — MAGNESIUM OXIDE -MG SUPPLEMENT 400 (240 MG) MG PO TABS
400.0000 mg | ORAL_TABLET | Freq: Every day | ORAL | Status: DC
  Administered 2021-08-30: 400 mg via ORAL
  Filled 2021-08-30 (×5): qty 1

## 2021-08-30 MED ORDER — ACETAMINOPHEN 325 MG PO TABS: 650.0000 mg | ORAL_TABLET | Freq: Four times a day (QID) | ORAL | Status: DC | PRN

## 2021-08-30 MED ORDER — ENSURE ENLIVE PO LIQD
237.0000 mL | Freq: Two times a day (BID) | ORAL | Status: DC
  Administered 2021-08-30 – 2021-08-31 (×2): 237 mL via ORAL
  Filled 2021-08-30 (×6): qty 237

## 2021-08-30 MED ORDER — POTASSIUM CHLORIDE CRYS ER 20 MEQ PO TBCR
40.0000 meq | EXTENDED_RELEASE_TABLET | Freq: Once | ORAL | Status: AC
  Administered 2021-08-30: 40 meq via ORAL
  Filled 2021-08-30 (×2): qty 2

## 2021-08-30 MED ORDER — MAGNESIUM HYDROXIDE 400 MG/5ML PO SUSP
30.0000 mL | Freq: Every day | ORAL | Status: DC | PRN
Start: 2021-08-30 — End: 2021-08-31

## 2021-08-30 MED ORDER — NICOTINE 14 MG/24HR TD PT24
14.0000 mg | MEDICATED_PATCH | Freq: Every day | TRANSDERMAL | Status: DC
  Filled 2021-08-30 (×2): qty 1

## 2021-08-30 MED ORDER — GABAPENTIN 100 MG PO CAPS
100.0000 mg | ORAL_CAPSULE | Freq: Three times a day (TID) | ORAL | Status: DC | PRN
  Administered 2021-08-30 (×2): 100 mg via ORAL
  Filled 2021-08-30 (×2): qty 1

## 2021-08-30 MED ORDER — NICOTINE POLACRILEX 2 MG MT GUM
2.0000 mg | CHEWING_GUM | OROMUCOSAL | Status: DC | PRN
  Administered 2021-08-30: 2 mg via ORAL

## 2021-08-30 MED ORDER — QUETIAPINE FUMARATE 50 MG PO TABS
50.0000 mg | ORAL_TABLET | Freq: Every day | ORAL | Status: DC
  Administered 2021-08-30: 50 mg via ORAL
  Filled 2021-08-30 (×3): qty 1

## 2021-08-30 MED ORDER — ALUM & MAG HYDROXIDE-SIMETH 200-200-20 MG/5ML PO SUSP: 30.0000 mL | Freq: Four times a day (QID) | ORAL | Status: DC | PRN

## 2021-08-30 MED ORDER — TRAZODONE HCL 50 MG PO TABS: 50.0000 mg | ORAL_TABLET | Freq: Every evening | ORAL | Status: DC | PRN

## 2021-08-30 MED ORDER — PREGABALIN 50 MG PO CAPS
50.0000 mg | ORAL_CAPSULE | Freq: Every day | ORAL | Status: DC
  Administered 2021-08-30: 50 mg via ORAL
  Filled 2021-08-30: qty 1

## 2021-08-30 MED ORDER — NICOTINE POLACRILEX 2 MG MT GUM
CHEWING_GUM | OROMUCOSAL | Status: AC
  Filled 2021-08-30: qty 1

## 2021-08-30 MED ORDER — ACETAMINOPHEN 325 MG PO TABS
650.0000 mg | ORAL_TABLET | ORAL | Status: DC | PRN
Start: 2021-08-30 — End: 2021-08-30

## 2021-08-30 MED ORDER — ONDANSETRON HCL 4 MG PO TABS
4.0000 mg | ORAL_TABLET | Freq: Three times a day (TID) | ORAL | Status: DC | PRN
Start: 2021-08-30 — End: 2021-08-30

## 2021-08-30 MED ORDER — HYDROXYZINE HCL 25 MG PO TABS: 25.0000 mg | ORAL_TABLET | Freq: Three times a day (TID) | ORAL | Status: DC | PRN

## 2021-08-30 MED ORDER — CLONIDINE HCL 0.1 MG PO TABS
0.2000 mg | ORAL_TABLET | Freq: Once | ORAL | Status: DC | PRN
  Filled 2021-08-30: qty 2

## 2021-08-30 NOTE — H&P (Addendum)
Psychiatric Admission Assessment Adult  Patient Identification: Brian Thomas MRN:  [id number] Date of Evaluation:  08/30/2021 Chief Complaint:  GAD (generalized anxiety disorder) [F41.1] Principal Diagnosis: GAD (generalized anxiety disorder) Diagnosis:  Principal Problem:   GAD (generalized anxiety disorder)  History of Present Illness: Report received, records reviewed and provider met patient in the room admission assessment.  Patient is  a 40 years old Caucasian male who is admitted this afternoon from [facility] ER to [facility] for anxiety and insomnia.   Patient has a hx GAD, MDD and Benzodiazepine Dependence.  [facility]  but reported he is not able to sleep.  He is severely anxious because he is afraid of loosing his job and loosing everything.  Patient was discharged from the unit September 13- 17th 2022 after treatment of depression and anxiety and he wanted to be detox off Ativan.  Today he reported that [facility] off [facility]epam.  He reported that  the withdrawal symptom was horrible with Clonazepam.  He appears to have been on various Benzodiazepine for a while-Ativan, Clonazepam and Xanax.  Patient presented anxious, tearful and afraid he is about to loose his job and loose everything.  He reported 4-5 hours sleep and reported that he tried various sleep medications-Melatonin, Trazodone and none were effective.  He reported that Seroquel was effective for sleep although he took it Sporadically.  Patient does not want to take anymore Benzodiazepine in his life again.  He denied feeling depressed today and stated his anxiety is related to the fact that he may loose his job and loose everything.  He rated his anxiety as the worst thing that has ever happened to him 10/10 with 10 being severe anxiety.  His stressors again is the fear of loosing his job and loosing everything,.  He denied drinking Coffee and only  took a glass of Soda in the dinning room.  He denied daytime nap stating that he does not know why he cannot sleep.  He want to start sleeping well and leave tomorrow but was told this is not realistic as we need more time to finish evaluation and medication management.  He denied SI/HI/AVH and no paranoia.  We will start Quetiapine 50 mg at bed time for mood and sleep and he declined Lyrica stating he does not want too much medications. Associated Signs/Symptoms: Depression Symptoms:  depressed mood, insomnia, anxiety, disturbed sleep, weight loss, Duration of Depression Symptoms: Greater than two weeks  (Hypo) Manic Symptoms:   na Anxiety Symptoms:  Excessive Worry, Psychotic Symptoms:   na PTSD Symptoms: NA Total Time spent with patient:  50 minutes  Past Psychiatric History: 2 previous in patient Psychiatric hospitalization at [facility]-2007-08/31-09/02,  09/13-0917 2022.  Diagnosed with GAD, MDD   Is the patient at risk to self? No.  Has the patient been a risk to self in the past 6 months? No.  Has the patient been a risk to self within the distant past? No.  Is the patient a risk to others? No.  Has the patient been a risk to others in the past 6 months? No.  Has the patient been a risk to others within the distant past? No.   Prior Inpatient Therapy:   Prior Outpatient Therapy:    Alcohol Screening: 1. How often do you have a drink containing alcohol?: Never 2. How many drinks containing alcohol do you have on a typical day when you  are drinking?: 1 or 2 3. How often do you have six or more drinks on one occasion?: Never AUDIT-C Score: 0 4. How often during the last year have you found that you were not able to stop drinking once you had started?: Never 5. How often during the last year have you failed to do what was normally expected from you because of drinking?: Never 6. How often during the last year have you needed a first drink in the morning to get yourself going after  a heavy drinking session?: Never 7. How often during the last year have you had a feeling of guilt of remorse after drinking?: Never 8. How often during the last year have you been unable to remember what happened the night before because you had been drinking?: Never 9. Have you or someone else been injured as a result of your drinking?: No 10. Has a relative or friend or a doctor or another health worker been concerned about your drinking or suggested you cut down?: No Alcohol Use Disorder Identification Test Final Score (AUDIT): 0 Substance Abuse History in the last 12 months:  No. Consequences of Substance Abuse: NA Previous Psychotropic Medications: Yes  Psychological Evaluations: Yes  Past Medical History:  Past Medical History:  Diagnosis Date   Pneumonia    History reviewed. No pertinent surgical history. Family History: History reviewed. No pertinent family history. Family Psychiatric  History: none Tobacco Screening:   Social History:  Social History   Substance and Sexual Activity  Alcohol Use No     Social History   Substance and Sexual Activity  Drug Use Yes   Comment: takes Delta 8 last use Friday 08/01/21    Additional Social History: Marital status: Divorced Divorced, when?: 4 years What types of issues is patient dealing with in the relationship?: none reported, states "she found someone she rather be with" Are you sexually active?: No What is your sexual orientation?: Heterosexual Has your sexual activity been affected by drugs, alcohol, medication, or emotional stress?: No Does patient have children?: Yes How many children?: 1 How is patient's relationship with their children?: 41 year old daughter. "We get along well, I want to keep it that way"                         Allergies:  No Known Allergies Lab Results:  Results for orders placed or performed during the hospital encounter of 08/29/21 (from the past 48 hour(s))  Comprehensive metabolic  panel     Status: Abnormal   Collection Time: 08/30/21 12:39 AM  Result Value Ref Range   Sodium 140 135 - 145 mmol/L   Potassium 3.3 (L) 3.5 - 5.1 mmol/L   Chloride 99 98 - 111 mmol/L   CO2 33 (H) 22 - 32 mmol/L   Glucose, Bld 118 (H) 70 - 99 mg/dL    Comment: Glucose reference range applies only to samples taken after fasting for at least 8 hours.   BUN 15 6 - 20 mg/dL   Creatinine, Ser 0.74 0.61 - 1.24 mg/dL   Calcium 9.6 8.9 - 10.3 mg/dL   Total Protein 7.5 6.5 - 8.1 g/dL   Albumin 4.5 3.5 - 5.0 g/dL   AST 20 15 - 41 U/L   ALT 33 0 - 44 U/L   Alkaline Phosphatase 66 38 - 126 U/L   Total Bilirubin 0.4 0.3 - 1.2 mg/dL   GFR, Estimated >60 >60 mL/min  Comment: (NOTE) Calculated using the CKD-EPI Creatinine Equation (2021)    Anion gap 8 5 - 15    Comment: Performed at [facility], [place] [address], [place], [place] [address]  Ethanol     Status: None   Collection Time: 08/30/21 12:39 AM  Result Value Ref Range   Alcohol, Ethyl (B) <10 <10 mg/dL    Comment: (NOTE) Lowest detectable limit for serum alcohol is 10 mg/dL.  For medical purposes only. Performed at [facility], [place] [address], [place], [place] [address]   Urine rapid drug screen (hosp performed)     Status: Abnormal   Collection Time: 08/30/21 12:39 AM  Result Value Ref Range   Opiates NONE DETECTED NONE DETECTED   Cocaine NONE DETECTED NONE DETECTED   Benzodiazepines POSITIVE (A) NONE DETECTED   Amphetamines NONE DETECTED NONE DETECTED   Tetrahydrocannabinol NONE DETECTED NONE DETECTED   Barbiturates NONE DETECTED NONE DETECTED    Comment: (NOTE) DRUG SCREEN FOR MEDICAL PURPOSES ONLY.  IF CONFIRMATION IS NEEDED FOR ANY PURPOSE, NOTIFY LAB WITHIN 5 DAYS.  LOWEST DETECTABLE LIMITS FOR URINE DRUG SCREEN Drug Class                     Cutoff (ng/mL) Amphetamine and metabolites    1000 Barbiturate and metabolites    200 Benzodiazepine                 532 Tricyclics  and metabolites     300 Opiates and metabolites        300 Cocaine and metabolites        300 THC                            50 Performed at [facility], [place] [address] [place], [place] [address]   CBC with Diff     Status: Abnormal   Collection Time: 08/30/21 12:39 AM  Result Value Ref Range   WBC 12.2 (H) 4.0 - 10.5 K/uL   RBC 4.77 4.22 - 5.81 MIL/uL   Hemoglobin 15.6 13.0 - 17.0 g/dL   HCT 45.5 39.0 - 52.0 %   MCV 95.4 80.0 - 100.0 fL   MCH 32.7 26.0 - 34.0 pg   MCHC 34.3 30.0 - 36.0 g/dL   RDW 11.9 11.5 - 15.5 %   Platelets 256 150 - 400 K/uL   nRBC 0.0 0.0 - 0.2 %   Neutrophils Relative % 51 %   Neutro Abs 6.3 1.7 - 7.7 K/uL   Lymphocytes Relative 32 %   Lymphs Abs 4.0 0.7 - 4.0 K/uL   Monocytes Relative 10 %   Monocytes Absolute 1.2 (H) 0.1 - 1.0 K/uL   Eosinophils Relative 5 %   Eosinophils Absolute 0.6 (H) 0.0 - 0.5 K/uL   Basophils Relative 1 %   Basophils Absolute 0.1 0.0 - 0.1 K/uL   Immature Granulocytes 1 %   Abs Immature Granulocytes 0.06 0.00 - 0.07 K/uL    Comment: Performed at [facility], [place] [address], [place], [place] [address]  Acetaminophen level     Status: Abnormal   Collection Time: 08/30/21 12:39 AM  Result Value Ref Range   Acetaminophen (Tylenol), Serum <10 (L) 10 - 30 ug/mL    Comment: (NOTE) Therapeutic concentrations vary significantly. A range of 10-30 ug/mL  may be an effective concentration for many patients. However, some  are best treated  at concentrations outside of this range. Acetaminophen concentrations >150 ug/mL at 4 hours after ingestion  and >50 ug/mL at 12 hours after ingestion are often associated with  toxic reactions.  Performed at [facility], [address], [place], [place] [address]   Salicylate level     Status: Abnormal   Collection Time: 08/30/21 12:39 AM  Result Value Ref Range   Salicylate Lvl <7.0 (L) 7.0 - 30.0 mg/dL    Comment: Performed at  [facility], [address], [place], [place] [address]  Resp Panel by RT-PCR (Flu A&B, Covid) Nasopharyngeal Swab     Status: None   Collection Time: 08/30/21  2:00 AM   Specimen: Nasopharyngeal Swab; Nasopharyngeal(NP) swabs in vial transport medium  Result Value Ref Range   SARS Coronavirus 2 by RT PCR NEGATIVE NEGATIVE    Comment: (NOTE) SARS-CoV-2 target nucleic acids are NOT DETECTED.  The SARS-CoV-2 RNA is generally detectable in upper respiratory specimens during the acute phase of infection. The lowest concentration of SARS-CoV-2 viral copies this assay can detect is 138 copies/mL. A negative result does not preclude SARS-Cov-2 infection and should not be used as the sole basis for treatment or other patient management decisions. A negative result may occur with  improper specimen collection/handling, submission of specimen other than nasopharyngeal swab, presence of viral mutation(s) within the areas targeted by this assay, and inadequate number of viral copies(<138 copies/mL). A negative result must be combined with clinical observations, patient history, and epidemiological information. The expected result is Negative.  Fact Sheet for Patients:  BloggerCourse.com  Fact Sheet for Healthcare Providers:  SeriousBroker.it  This test is no t yet approved or cleared by the [place] FDA and  has been authorized for detection and/or diagnosis of SARS-CoV-2 by FDA under an Emergency Use Authorization (EUA). This EUA will remain  in effect (meaning this test can be used) for the duration of the COVID-19 declaration under Section 564(b)(1) of the Act, 21 U.S.C.section 360bbb-3(b)(1), unless the authorization is terminated  or revoked sooner.       Influenza A by PCR NEGATIVE NEGATIVE   Influenza B by PCR NEGATIVE NEGATIVE    Comment: (NOTE) The Xpert Xpress SARS-CoV-2/FLU/RSV plus assay is intended as  an aid in the diagnosis of influenza from Nasopharyngeal swab specimens and should not be used as a sole basis for treatment. Nasal washings and aspirates are unacceptable for Xpert Xpress SARS-CoV-2/FLU/RSV testing.  Fact Sheet for Patients: BloggerCourse.com  Fact Sheet for Healthcare Providers: SeriousBroker.it  This test is not yet approved or cleared by the [place] FDA and has been authorized for detection and/or diagnosis of SARS-CoV-2 by FDA under an Emergency Use Authorization (EUA). This EUA will remain in effect (meaning this test can be used) for the duration of the COVID-19 declaration under Section 564(b)(1) of the Act, 21 U.S.C. section 360bbb-3(b)(1), unless the authorization is terminated or revoked.  Performed at [facility], [address], [place], [place] [address]     Blood Alcohol level:  Lab Results  Component Value Date   [facility] <10 08/30/2021   ETH <10 08/19/2021    Metabolic Disorder Labs:  Lab Results  Component Value Date   HGBA1C 4.6 (L) 08/06/2021   MPG 85.32 08/06/2021   No results found for: PROLACTIN Lab Results  Component Value Date   CHOL 169 08/06/2021   TRIG 68 08/06/2021   HDL 39 (L) 08/06/2021   CHOLHDL 4.3 08/06/2021  VLDL 14 08/06/2021   LDLCALC 116 (H) 08/06/2021    Current Medications: Current Facility-Administered Medications  Medication Dose Route Frequency Provider Last Rate Last Admin   acetaminophen (TYLENOL) tablet 650 mg  650 mg Oral Q6H PRN Bobbitt, Shalon E, NP       alum & mag hydroxide-simeth (MAALOX/MYLANTA) 200-200-20 MG/5ML suspension 30 mL  30 mL Oral Q4H PRN Bobbitt, Shalon E, NP       feeding supplement (ENSURE ENLIVE / ENSURE PLUS) liquid 237 mL  237 mL Oral BID BM Rosezetta Schlatter, MD   237 mL at 08/30/21 1455   hydrOXYzine (ATARAX/VISTARIL) tablet 25 mg  25 mg Oral TID PRN Bobbitt, Shalon E, NP       magnesium hydroxide (MILK OF  MAGNESIA) suspension 30 mL  30 mL Oral Daily PRN Bobbitt, Shalon E, NP       nicotine (NICODERM CQ - dosed in mg/24 hours) patch 14 mg  14 mg Transdermal Daily Cosby, Loma Sousa, MD       QUEtiapine (SEROQUEL) tablet 50 mg  50 mg Oral QHS Elbridge Magowan C, NP       traZODone (DESYREL) tablet 50 mg  50 mg Oral QHS PRN Charmaine Downs C, NP       PTA Medications: Medications Prior to Admission  Medication Sig Dispense Refill Last Dose   clonazePAM (KLONOPIN) 0.25 MG disintegrating tablet 0.25-0.75 mg See admin instructions. Week One: Take 0.$RemoveBef'75mg'pnAMksTSKf$  sublingual twice daily. Week Two: Take 0.$RemoveBef'75mg'LIMOUxBVoi$  in the morning and 0.$RemoveBefor'5mg'wIOmerVYpgIp$  in the evening. Week Three: Take 0.$RemoveBefor'5mg'cPoMxTUxzpaH$  twice daily. Week Four: Take 0.$RemoveBefo'5mg'hXWzOwwWfVj$  in the morning and 0.$RemoveBefor'25mg'TJoTFirZoWBk$  in the evening. Week Five: Take 0.25 twice daily. Week Six: Take 0.$RemoveBef'25mg'fUuvZjoEhi$  daily. Then stop.      clonazePAM (KLONOPIN) 0.5 MG tablet Take 1 tablet (0.5 mg total) by mouth 2 (two) times daily. Taper medication by 0.25 mg, or 1/2 tablet, weekly for 4 weeks. (Patient not taking: Reported on 08/19/2021) 63 tablet 0    QUEtiapine (SEROQUEL) 25 MG tablet Take 1 tablet (25 mg total) by mouth at bedtime. 30 tablet 0    sertraline (ZOLOFT) 25 MG tablet Take 1 tablet (25 mg total) by mouth daily. (Patient not taking: No sig reported) 30 tablet 0    traZODone (DESYREL) 50 MG tablet Take 50 mg by mouth at bedtime.       Musculoskeletal: Strength & Muscle Tone: within normal limits Gait & Station: normal Patient leans: Front Psychiatric Specialty Exam:  Presentation  General Appearance: Casual  Eye Contact:Fair  Speech:Clear and Coherent  Speech Volume:Decreased  Handedness:Right   Mood and Affect  Mood:Anxious  Affect:Tearful; Constricted   Thought Process  Thought Processes:Goal Directed  Duration of Psychotic Symptoms: N/A  Past Diagnosis of Schizophrenia or Psychoactive disorder: No  Descriptions of Associations:Loose  Orientation:Full (Time, Place and  Person)  Thought Content:Logical; Scattered  Hallucinations:Hallucinations: None  Ideas of Reference:None  Suicidal Thoughts:Suicidal Thoughts: Yes, Passive SI Passive Intent and/or Plan: Without Intent; Without Plan  Homicidal Thoughts:Homicidal Thoughts: No   Sensorium  Memory:Remote Fair; Recent Fair; Immediate Fair  Judgment:Fair  Insight:Fair   Executive Functions  Concentration:Fair  Attention Span:Fair  [place]   Psychomotor Activity  Psychomotor Activity:Psychomotor Activity: Restlessness   Assets  Assets:Communication Skills; Desire for Improvement; Physical Health; Resilience; Transportation; Vocational/Educational; Social Support   Sleep  Sleep:Sleep: Poor Number of Hours of Sleep: 0    Physical Exam: Physical Exam Vitals and nursing note reviewed.  Constitutional:  Appearance: Normal appearance.  HENT:     Head: Normocephalic and atraumatic.     Nose: Nose normal.  Cardiovascular:     Rate and Rhythm: Normal rate and regular rhythm.  Pulmonary:     Effort: Pulmonary effort is normal.  Musculoskeletal:        General: Normal range of motion.     Cervical back: Normal range of motion.  Skin:    General: Skin is warm and dry.  Neurological:     General: No focal deficit present.     Mental Status: He is alert and oriented to person, place, and time.   Review of Systems  Constitutional: Negative.   HENT: Negative.    Eyes: Negative.   Respiratory: Negative.    Cardiovascular: Negative.   Gastrointestinal: Negative.   Genitourinary: Negative.   Musculoskeletal: Negative.   Skin: Negative.   Neurological: Negative.   Endo/Heme/Allergies: Negative.   Psychiatric/Behavioral:  The patient is nervous/anxious and has insomnia.   Blood pressure (!) 127/93, pulse 99, temperature 98.1 F (36.7 C), temperature source Oral, resp. rate 20, height $RemoveBe'5\' 10"'MBczqwlhl$  (1.778 m), weight 59.4 kg, SpO2 100 %.  Body mass index is 18.8 kg/m.  Treatment Plan Summary: Daily contact with patient to assess and evaluate symptoms and progress in treatment and Medication management  Start Quetiapine 50 mg po at bed time for mood and sleep Trazodone 50 mg po at bed time as needed for sleep if Quetiapine is not effective tonight.  Plan to increase Quetiapine if needed. Offer PRN per protocol Start Nicotine patch 14 mg transdermal q 24 hrs for Nicotine Craving. Encourage group therapy participation Observation Level/Precautions:  15 minute checks  Laboratory:  CBC Chemistry Profile UDS UA- Reviewed results all Negative.  UDS positive for Benzo -given in the ER.    Medications:  See MAR  Consultations:  NA  Discharge Concerns:  NA  Estimated LOS: 2-3 days  Other:     Physician Treatment Plan for Primary Diagnosis: GAD (generalized anxiety disorder) Long Term Goal(s): Improvement in symptoms so as ready for discharge  Short Term Goals: Ability to identify changes in lifestyle to reduce recurrence of condition will improve, Ability to verbalize feelings will improve, Ability to disclose and discuss suicidal ideas, Ability to demonstrate self-control will improve, Ability to identify and develop effective coping behaviors will improve, Ability to maintain clinical measurements within normal limits will improve, Compliance with prescribed medications will improve, and Ability to identify triggers associated with substance abuse/mental health issues will improve  Physician Treatment Plan for Secondary Diagnosis: Principal Problem:   GAD (generalized anxiety disorder)  Long Term Goal(s): Improvement in symptoms so as ready for discharge  Short Term Goals: Ability to identify changes in lifestyle to reduce recurrence of condition will improve, Ability to verbalize feelings will improve, Ability to disclose and discuss suicidal ideas, Ability to demonstrate self-control will improve, Ability to identify and  develop effective coping behaviors will improve, Ability to maintain clinical measurements within normal limits will improve, Compliance with prescribed medications will improve, Ability to identify triggers associated with substance abuse/mental health issues will improve, and Ability to sleep better everynight.  I certify that inpatient services furnished can reasonably be expected to improve the patient's condition.    Delfin Gant, NP-PMHNP-BC 10/8/20223:16 PM

## 2021-08-30 NOTE — Progress Notes (Addendum)
Patient is a 40 year old male who presented voluntarily from [facility] for passive SI and  hopelessness related to insomnia. Pt has a hx of GAD, MDD and benzo dependency, and was recently admitted to [facility] ( a month ago) for extreme anxiety and benzo dependency. Pt stated,  "ativan has ruined by life". Pt stated that he had "detoxed off of ativan at [facility], but has not been able to sleep since." Pt reported only sleeping 2 hrs a night. During admission interview, pt presented as extremely anxious, and concerned about losing his job, repeatedly stating, "I'm  screwed". 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 I've been out of work for 5 weeks." Pt currently denies SI/HI and A/VH. Admission paperwork completed and signed. Verbal understanding expressed. VS obtained and recorded. Belongings searched and secured in locker. Skin assessment revealed no abnormalities.  Pt oriented to the unit. Q 15 min checks initiated for safety.

## 2021-08-30 NOTE — Progress Notes (Signed)
Per Ysidro Evert Bobbitt,NP, patient meets criteria for inpatient treatment. There are no available or appropriate beds at [facility] today. CSW faxed referrals to the following facilities for review:  [facility]  Pending - No Request Sent N/A [address] [place] [address] [phone] --  [facility]  Pending - No Request Sent N/A [address], [place] [address] [phone] --  [facility]  Pending - No Request Sent N/A [address], [place] [address] [phone] --  [facility]  Pending - No Request Sent N/A [address] [place] [address] [phone] --  [facility]  Pending - No Request Sent N/A [address] [place] [address] [phone] --  [facility]  Pending - No Request Sent N/A [address] [place] [address] [phone] --  [facility]  Pending - No Request Sent N/A [address] [place] [address] [phone] --  [facility]  Pending - No Request Sent N/A [address] [place], [place] [address] [phone] --  [facility]  Pending - No Request Sent N/A [address] [place] [address] [phone] --  [facility] [place] [facility]  Pending - No Request Sent N/A [address] [place] [address] [phone] --  [facility]  Pending - No Request Sent N/A [address] [place] [address] [phone] --  [facility]  Pending - No Request Sent N/A [address] [place] [address] [phone] --  [facility]  Pending - No Request Sent N/A [address], [place] [address] [phone] --  [facility]  Pending  - No Request Sent N/A [address] [place] [address] [phone] --  [facility]  Pending - No Request Sent N/A [address] [place] [address] [phone] --  [facility]  Pending - No Request Sent N/A [address], [place] [address] [phone] --  [facility]  Pending - No Request Sent N/A [address] [place] [address] [phone] --  [facility]  Pending - No Request Sent N/A [address], [place] [address] [phone] --  [facility]  Pending - No Request Sent N/A [address], [place] [address] [phone] --  [facility]  Pending - No Request Sent N/A [address] [place] [address] [phone] --  [facility]  Pending - No Request Sent N/A [address] [place], [place] [address] [phone] --  [facility]  Pending - No Request Sent N/A [address] [place] [address] [phone] --  [facility]  Pending - No Request Sent N/A [address], [place] [address] [phone] --  [facility]  Pending - No Request Sent N/A [address] [place] [address] [phone] --   TTS will continue to seek bed placement.  Crissie Reese, MSW, LCSW-A, LCAS-A Phone: [phone] Disposition/TOC

## 2021-08-30 NOTE — ED Provider Notes (Signed)
Emergency Medicine Provider Triage Evaluation Note  Brian Thomas , a 40 y.o. male  was evaluated in triage.  Pt complains of anxiety and insomnia.  He was seen at [facility] and sent here according to his report. He states that he has not slept in multiple days.  He states that he feels hopeless by his situation.  He states that he has been sleep deprived to the point that he does not feel it is safe for him to be driving. He requests that I not order him any benzos as he blames Ativan for ruining his life.  Review of Systems  Positive: Hopeless, anxiety, insomnia Negative: Fevers, HI  Physical Exam  BP (!) 129/101 (BP Location: Left Arm)   Pulse (!) 107   Temp 98.5 F (36.9 C) (Oral)   Resp 16   SpO2 96%  Gen:   Awake, no distress   Resp:  Normal effort  MSK:   Moves extremities without difficulty  Other:  Patient is tearful.  Medical Decision Making  Medically screening exam initiated at 12:09 AM.  Appropriate orders placed.  Talmage Teaster was informed that the remainder of the evaluation will be completed by another provider, this initial triage assessment does not replace that evaluation, and the importance of remaining in the ED until their evaluation is complete.  Patient denies SI, HI, or AVH however he does admit to feeling hopeless with his overall situation and afraid that if he does not get his sleep and anxiety improved that he will lose his job.  Psych orders are placed.  Note: Portions of this report may have been transcribed using voice recognition software. Every effort was made to ensure accuracy; however, inadvertent computerized transcription errors may be present    Norman Clay 08/30/21 0012    Glendora Score, MD 08/30/21 [phone]

## 2021-08-30 NOTE — Progress Notes (Signed)
Pt repeatedly states "I just want to be able to sleep"  Pt states he needs to be out of here by Tuesday for work.    08/30/21 2243  Psych Admission Type (Psych Patients Only)  Admission Status Voluntary  Psychosocial Assessment  Patient Complaints Anxiety;Insomnia  Eye Contact Intense  Facial Expression Animated  Affect Anxious  Speech Logical/coherent  Interaction Assertive  Motor Activity Fidgety;Pacing;Restless  Appearance/Hygiene Unremarkable  Behavior Characteristics Appropriate to situation;Fidgety;Restless  Mood Anxious  Thought Process  Coherency WDL  Content WDL  Delusions None reported or observed  Perception WDL  Hallucination None reported or observed  Judgment Impaired  Confusion None  Danger to Self  Current suicidal ideation? Denies  Danger to Others  Danger to Others None reported or observed

## 2021-08-30 NOTE — [facility]HH Suicide Risk Assessment (Signed)
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 He had recently completed a detox for several benzodiazepines, and despite several non-controlled medication trials, he was not able to get adequate sleep and has recently stopped sleeping completely. It has gotten to the point that he cannot function and he is afraid he will lose his job. He states "my whole life hangs in the balance" and is already advocating for discharge by Monday to return to work. He has tried several OTC medications. The ones with diphenhydramine make his RLS much worse, and melatonin did not work. Seroquel wears off after a few hours and then stops after a while. He cannot have benzos. He denies suicidal/homicidal ideation. No hallucinations.   Continued Clinical Symptoms:  Alcohol Use Disorder Identification Test Final Score (AUDIT): 0 The "Alcohol Use Disorders Identification Test", Guidelines for Use in Primary Care, Second Edition.  World Science writer [facility]). Score between 0-7:  no or low risk or alcohol related problems. Score between 8-15:  moderate risk of alcohol related problems. Score between 16-19:  high risk of alcohol related problems. Score 20 or above:  warrants further diagnostic evaluation for alcohol dependence and treatment.   CLINICAL FACTORS:   Severe Anxiety and/or  Agitation Depression:   Hopelessness Insomnia   Musculoskeletal: Strength & Muscle Tone: within normal limits Gait & Station: normal Patient leans: N/A  Psychiatric Specialty Exam:  Presentation  General Appearance: Casual  Eye Contact:Fair  Speech:Clear and Coherent  Speech Volume:Decreased  Handedness:Right   Mood and Affect  Mood:Anxious  Affect:Tearful; Constricted   Thought Process  Thought Processes:Goal Directed  Descriptions of Associations:Loose  Orientation:Full (Time, Place and Person)  Thought Content:Logical; Scattered  History of Schizophrenia/Schizoaffective disorder:No  Duration of Psychotic Symptoms:N/A  Hallucinations:Hallucinations: None  Ideas of Reference:None  Suicidal Thoughts:Suicidal Thoughts: Yes, Passive SI Passive Intent and/or Plan: Without Intent; Without Plan  Homicidal Thoughts:Homicidal Thoughts: No   Sensorium  Memory:Remote Fair; Recent Fair; Immediate Fair  Judgment:Fair  Insight:Fair   Executive Functions  Concentration:Fair  Attention Span:Fair  Recall:Fair  Fund of Knowledge:Fair  Language:Fair   Psychomotor Activity  Psychomotor Activity:Psychomotor Activity: Restlessness   Assets  Assets:Communication Skills; Desire for Improvement; Physical Health; Resilience; Transportation; Vocational/Educational; Social Support   Sleep  Sleep:Sleep: Poor Number of Hours of Sleep: 0    Physical Exam: Physical Exam Vitals and nursing note reviewed.  Constitutional:      Appearance: Normal appearance.  HENT:     Head: Normocephalic and atraumatic.     Nose: Nose normal.  Eyes:     Extraocular Movements: Extraocular movements intact.  Pulmonary:     Effort: Pulmonary effort is normal.  Musculoskeletal:     Cervical back: Normal range of motion.  Neurological:     General: No focal deficit present.     Mental Status: He is alert and oriented to person, place, and time.  Psychiatric:  Attention and Perception: He is attentive.        Mood and Affect: Mood is anxious and depressed.        Speech: Speech normal.        Behavior: Behavior is hyperactive.        Thought Content: Thought content normal.        Cognition and Memory: Cognition normal.        Judgment: Judgment is impulsive.   ROS Blood pressure (!) 127/93, pulse 99, temperature 98.1 F (36.7 C), temperature source Oral, resp. rate 20, height 5\' 10"  (1.778 m), weight 59.4 kg, SpO2 100 %. Body mass index is 18.8 kg/m.   COGNITIVE FEATURES THAT CONTRIBUTE TO RISK:  Loss of executive function    SUICIDE RISK:   Mild:  Suicidal ideation of limited frequency, intensity, duration, and specificity.  There are no identifiable plans, no associated intent, mild dysphoria and related symptoms, good self-control (both objective and subjective assessment), few other risk factors, and identifiable protective factors, including available and accessible social support.  PLAN OF CARE: Safety and Monitoring --  Admission to inpatient psychiatric unit for safety, stabilization and treatment -- Daily contact with patient to assess and evaluate symptoms and progress in treatment -- Patient's case to be discussed in multi-disciplinary team meeting. -- Patient will be encouraged to participate in the therapeutic group milieu. -- Observation Level : q15 minute checks -- Vital signs:  q12 hours -- Precautions: suicide.  Plan  -Monitor Vitals. -Monitor for thoughts of harm to self or others -Monitor for psychosis, disorganization or changes to cognition -Monitor for withdrawal symptoms. -Monitor for medication side effects.  Labs/Studies: N/a  Medications: Lyrica 50mg  PO QHS for RLS Magnesium and potassium replacement Multivitamin  Seroquel for mood, anxiety and sleep NRT for smoking  Gabapentin 100mg  PO TID PRN anxiety (antihistamines will make RLS worse)  I certify that inpatient services furnished can reasonably be  expected to improve the patient's condition.   , MD 08/30/2021, 4:06 PM

## 2021-08-30 NOTE — ED Notes (Signed)
Pt has a sweater, shirt, sweat pants, a pair of shoes and cell phone. Items placed in a white bag and are located behind nurses station of [place] [address].

## 2021-08-30 NOTE — Progress Notes (Signed)
Adult Psychoeducational Group Note  Date:  08/30/2021 Time:  9:16 PM  Group Topic/Focus:  Wrap-Up Group:   The focus of this group is to help patients review their daily goal of treatment and discuss progress on daily workbooks.  Participation Level:  Active  Participation Quality:  Appropriate  Affect:  Appropriate  Cognitive:  Appropriate  Insight: Appropriate  Engagement in Group:  Distracting  Modes of Intervention:  Discussion  Additional Comments:  patient said his day was a 4. His goal for today to get sleep.   Charna Busman Long 08/30/2021, 9:16 PM

## 2021-08-30 NOTE — ED Notes (Signed)
Patient was given breakfast tray. 

## 2021-08-30 NOTE — Tx Team (Signed)
Initial Treatment Plan 08/30/2021 1:50 PM [name] [id number]    PATIENT STRESSORS: Occupational concerns   Other: insomnia     PATIENT STRENGTHS: Average or above average intelligence  Capable of independent living  Communication skills  Motivation for treatment/growth  Physical Health  Work skills    PATIENT IDENTIFIED PROBLEMS: Passive SI   Hopelessness "I feel like I'm going to lose everything"  Insomnia  Increased anxiety               DISCHARGE CRITERIA:  Improved stabilization in mood, thinking, and/or behavior Verbal commitment to aftercare and medication compliance  PRELIMINARY DISCHARGE PLAN: Outpatient therapy Return to previous living arrangement Return to previous work or school arrangements  PATIENT/FAMILY INVOLVEMENT: This treatment plan has been presented to and reviewed with the patient, Brian Thomas, .  The patient has been given the opportunity to ask questions and make suggestions.  Shela Nevin, RN 08/30/2021, 1:50 PM

## 2021-08-30 NOTE — BHH Counselor (Signed)
[name], patient, provided written consent for CSW team to coordinate aftercare at this time. CSW to have further conversations with patient regarding care in the community.   Patient interested in therapy and med mgmt, though expressed concern over cost and whether ins will cover.    Signed:  Corky Crafts, MSW, [place], LCASA 08/30/2021 2:46 PM

## 2021-08-30 NOTE — ED Provider Notes (Signed)
[facility]-EMERGENCY DEPT Provider Note   CSN: [id number] Arrival date & time: 08/29/21  2130     History Chief Complaint  Patient presents with   Insomnia   Anxiety    Brian Thomas is a 40 y.o. male with a history of tobacco use, depression, and anxiety who presents to the emergency department with complaints of difficulty sleeping and increased stress over the past 5 days.  Patient states he has not been able to sleep for multiple days, he is getting very anxious/stressed about his situation and becoming hopeless with thoughts of harming himself/suicide, no attempts or plan.  He reports auditory hallucinations.  Overall symptoms are aggravated by lack of sleep.  No other alleviating or aggravating factors.  He states he has had issues with sleep due to being prescribed Ativan for an extended period of time, used to take 2 mg 3 times daily, has not had Ativan in a few weeks.  He was seen by behavioral health who then sent him to the emergency department.  He denies homicidal ideation.  He denies drug or alcohol use.  HPI     Past Medical History:  Diagnosis Date   Pneumonia     Patient Active Problem List   Diagnosis Date Noted   MDD (major depressive disorder), recurrent severe, without psychosis (HCC) 08/05/2021   GAD (generalized anxiety disorder) 08/05/2021   Benzodiazepine dependence (HCC) 08/05/2021    History reviewed. No pertinent surgical history.     No family history on file.  Social History   Tobacco Use   Smoking status: Every Day    Packs/day: 1.00    Years: 20.00    Pack years: 20.00    Types: Cigarettes   Smokeless tobacco: Never  Vaping Use   Vaping Use: Never used  Substance Use Topics   Alcohol use: No   Drug use: Yes    Comment: takes Delta 8 last use Friday 08/01/21    Home Medications Prior to Admission medications   Medication Sig Start Date End Date Taking? Authorizing Provider  clonazePAM (KLONOPIN) 0.25 MG  disintegrating tablet 0.25-0.75 mg See admin instructions. Week One: Take 0.75mg  sublingual twice daily. Week Two: Take 0.75mg  in the morning and 0.5mg  in the evening. Week Three: Take 0.5mg  twice daily. Week Four: Take 0.5mg  in the morning and 0.25mg  in the evening. Week Five: Take 0.25 twice daily. Week Six: Take 0.25mg  daily. Then stop. 08/15/21   [provider]  clonazePAM (KLONOPIN) 0.5 MG tablet Take 1 tablet (0.5 mg total) by mouth 2 (two) times daily. Taper medication by 0.25 mg, or 1/2 tablet, weekly for 4 weeks. Patient not taking: Reported on 08/19/2021 08/09/21 08/09/22  Laveda Abbe, NP  ibuprofen (ADVIL) 200 MG tablet Take 200 mg by mouth every 6 (six) hours as needed.    [provider]  MELATONIN PO Take 1 tablet by mouth at bedtime.    [provider]  Multiple Vitamin (MULTIVITAMIN WITH MINERALS) TABS tablet Take 1 tablet by mouth daily.    [provider]  nicotine polacrilex (NICORETTE) 2 MG gum Take 1 each (2 mg total) by mouth as needed for smoking cessation. Patient not taking: No sig reported 08/09/21   Laveda Abbe, NP  QUEtiapine (SEROQUEL) 25 MG tablet Take 1 tablet (25 mg total) by mouth at bedtime. 08/29/21 09/28/21  Gerhard Munch, MD  sertraline (ZOLOFT) 25 MG tablet Take 1 tablet (25 mg total) by mouth daily. Patient not taking: No sig  reported 08/10/21   Laveda Abbe, NP  traZODone (DESYREL) 50 MG tablet Take 50 mg by mouth at bedtime. 08/18/21   [provider]    Allergies    Patient has no known allergies.  Review of Systems   Review of Systems  Constitutional:  Negative for chills and fever.  Respiratory:  Negative for shortness of breath.   Cardiovascular:  Negative for chest pain.  Gastrointestinal:  Negative for abdominal pain.  Neurological:  Negative for syncope.  Psychiatric/Behavioral:  Positive for hallucinations, sleep disturbance and suicidal ideas. The patient is  nervous/anxious.   All other systems reviewed and are negative.  Physical Exam Updated Vital Signs BP (!) 116/93 (BP Location: Left Arm)   Pulse (!) 102   Temp 98.5 F (36.9 C) (Oral)   Resp 20   SpO2 99%   Physical Exam Vitals and nursing note reviewed.  Constitutional:      General: He is not in acute distress.    Appearance: He is well-developed. He is not toxic-appearing.  HENT:     Head: Normocephalic and atraumatic.  Eyes:     General:        Right eye: No discharge.        Left eye: No discharge.     Conjunctiva/sclera: Conjunctivae normal.  Cardiovascular:     Rate and Rhythm: Regular rhythm. Tachycardia present.  Pulmonary:     Effort: Pulmonary effort is normal. No respiratory distress.     Breath sounds: Normal breath sounds. No wheezing, rhonchi or rales.  Abdominal:     General: There is no distension.     Palpations: Abdomen is soft.     Tenderness: There is no abdominal tenderness.  Musculoskeletal:     Cervical back: Neck supple.  Skin:    General: Skin is warm and dry.     Findings: No rash.  Neurological:     Mental Status: He is alert.     Comments: Clear speech.   Psychiatric:        Mood and Affect: Mood is anxious.        Behavior: Behavior normal.        Thought Content: Thought content includes suicidal ideation.    ED Results / Procedures / Treatments   Labs (all labs ordered are listed, but only abnormal results are displayed) Labs Reviewed  COMPREHENSIVE METABOLIC PANEL - Abnormal; Notable for the following components:      Result Value   Potassium 3.3 (*)    CO2 33 (*)    Glucose, Bld 118 (*)    All other components within normal limits  RAPID URINE DRUG SCREEN, HOSP PERFORMED - Abnormal; Notable for the following components:   Benzodiazepines POSITIVE (*)    All other components within normal limits  CBC WITH DIFFERENTIAL/PLATELET - Abnormal; Notable for the following components:   WBC 12.2 (*)    Monocytes Absolute 1.2 (*)     Eosinophils Absolute 0.6 (*)    All other components within normal limits  ACETAMINOPHEN LEVEL - Abnormal; Notable for the following components:   Acetaminophen (Tylenol), Serum <10 (*)    All other components within normal limits  SALICYLATE LEVEL - Abnormal; Notable for the following components:   Salicylate Lvl <7.0 (*)    All other components within normal limits  RESP PANEL BY RT-PCR (FLU A&B, COVID) ARPGX2  ETHANOL    EKG EKG Interpretation  Date/Time:  Saturday August 30 2021 02:08:26 EDT Ventricular Rate:  110 PR  Interval:  117 QRS Duration: 75 QT Interval:  323 QTC Calculation: 437 R Axis:   82 Text Interpretation: Sinus tachycardia Right atrial enlargement Probable LVH with secondary repol abnrm Confirmed by Geoffery Lyons [phone]) on 08/30/2021 2:14:38 AM  Radiology No results found.  Procedures Procedures   Medications Ordered in ED Medications  QUEtiapine (SEROQUEL) tablet 50 mg (50 mg Oral Given 08/30/21 0218)  hydrOXYzine (ATARAX/VISTARIL) tablet 25 mg (25 mg Oral Given 08/30/21 0218)    ED Course  I have reviewed the triage vital signs and the nursing notes.  Pertinent labs & imaging results that were available during my care of the patient were reviewed by me and considered in my medical decision making (see chart for details).    MDM Rules/Calculators/A&P                           Patient presents to the ED with complaints of difficulty sleeping, depression, anxiety, and hallucinations. Was on ativan previously- has not had in 3 weeks therefore doubt acute benzo withdrawal. Mild tachycardia- on exam seems to fluctuate as patient becomes more upset then improves.   Additional history obtained:  Additional history obtained from chart review & nursing note review.   Lab Tests:  Screening labs have been reviewed including CBC, CMP, acetaminophen/salicylate/ethanol level, UDS: Mild hypokalemia- PO potassium ordered, bicarb mildly elevated, mild  leukocytosis felt to be nonspecific at this time.   ED Course:  Patient is medically cleared. Sent form [facility]- recommended for inpatient care- placement pending. PRN meds ordered. Patient denies being on any home meds. Additional orders placed by [facility].   The patient has been placed in psychiatric observation due to the need to provide a safe environment for the patient while obtaining psychiatric consultation and evaluation, as well as ongoing medical and medication management to treat the patient's condition.  The patient has not been placed under full IVC at this time.  Portions of this note were generated with Scientist, clinical (histocompatibility and immunogenetics). Dictation errors may occur despite best attempts at proofreading.  Final Clinical Impression(s) / ED Diagnoses Final diagnoses:  Anxiety    Rx / DC Orders ED Discharge Orders     None        Cherly Anderson, PA-C 08/30/21 0601    Geoffery Lyons, MD 08/30/21 [phone]

## 2021-08-30 NOTE — Consult Note (Addendum)
  Brian Thomas is a 40 year old male with a history of GAD, MDD, and benzodiazepine dependency who initially presented to [facility] for assessment of increased anxiety and insomnia. Patient report recently completing Ativan detox at [facility] after years of use for his GAD. Patient states as a result he has experienced increased depression, hopelessness and worthlessness. He becomes tearful stating he is in fear of "losing everything" as a result of "needing help". He reports only sleeping "a few hours every other night"; states he needs "help with getting to sleep" and is asking that he not receive any benzodiazepines. Orders for Hydroxyzine in place.   Patient accepted to [facility]; provider discussed admission process and programming. Patient in agreeance to treatment.

## 2021-08-30 NOTE — Progress Notes (Signed)
Per Tommy Medal, [facility], pt has been accepted to [facility] bed 301-02. Accepting provider is Lindell Noe, Attending provider is Dr. Mason Jim. Patient can arrive anytime. Number for report is [phone].   Crissie Reese, MSW, LCSW-A Phone: [phone] Disposition/TOC

## 2021-08-30 NOTE — B[facility] Group Notes (Signed)
Adult Psychoeducational Group Note  Date:  08/30/2021 Time:  5:20 PM  Group Topic/Focus:  Developing a Wellness Toolbox:   The focus of this group is to help patients develop a "wellness toolbox" with skills and strategies to promote recovery upon discharge.  Participation Level:  Active  Participation Quality:  Appropriate  Affect:  Appropriate  Cognitive:  Appropriate  Insight: Appropriate  Engagement in Group:  Engaged  Modes of Intervention:  Discussion  Additional Comments: Patient attended the Forgiveness group and participated.Brian Thomas 08/30/2021, 5:20 PM

## 2021-08-30 NOTE — ED Notes (Signed)
Pt Has a duffles bag that was placed in SAPU Rm 43

## 2021-08-30 NOTE — ED Notes (Addendum)
Vikki Ports, RN called for report. Pt signed voluntary consent form. [facility] called for transport.

## 2021-08-30 NOTE — [facility] Group Notes (Signed)
Patient did not attend Support System group led by the nurse.

## 2021-08-30 NOTE — B[facility] Suicide Risk Assessment (Signed)
[facility] INPATIENT:  Family/Significant Other Suicide Prevention Education  Suicide Prevention Education:  Patient Refusal for Family/Significant Other Suicide Prevention Education: The patient Brian Thomas has refused to provide written consent for family/significant other to be provided Family/Significant Other Suicide Prevention Education during admission and/or prior to discharge.  Physician notified.  Declined consent for this admission. Patient had completed SPE on prior admission. See SPE note dated 9/15 for collateral information from mother.   Corky Crafts 08/30/2021, 2:47 PM

## 2021-08-30 NOTE — BHH Counselor (Signed)
Adult Comprehensive Assessment  Patient ID: Brian Thomas, male   DOB: 1981-04-23, 40 y.o.   MRN: [id number]  Information Source: Information source: Patient  Current Stressors:  Patient states their primary concerns and needs for treatment are:: Pt states primary concern is "not being able to sleep" since being discharged from [facility] on 9/17 Patient states their goals for this hospitilization and ongoing recovery are:: "get sleep back, that is it." Educational / Learning stressors: none reported Employment / Job issues: Pt concerned that he is going to loose his job due to poor attendance since being hopitalized. States the company does not have FMLA. Family Relationships: none reported Financial / Lack of resources (include bankruptcy): none reported Housing / Lack of housing: none reported Physical health (include injuries & life threatening diseases): none reported Social relationships: "I don't really have any (friends)" Substance abuse: See SUD section below. Bereavement / Loss: Pt reports father passed in 11/01/2021though reports normative grieving.  Living/Environment/Situation:  Living Arrangements: Alone, Children Living conditions (as described by patient or guardian): WNL, Patient currently owns a [employer], Who else lives in the home?: Daughter on weekends only How long has patient lived in current situation?: 2 years What is atmosphere in current home: Comfortable  Family History:  Marital status: Divorced Divorced, when?: 4 years What types of issues is patient dealing with in the relationship?: none reported, states "she found someone she rather be with" Are you sexually active?: No What is your sexual orientation?: Heterosexual Has your sexual activity been affected by drugs, alcohol, medication, or emotional stress?: No Does patient have children?: Yes How many children?: 1 How is patient's relationship with their children?: 40 year old daughter. "We get along well, I  want to keep it that way"  Childhood History:  By whom was/is the patient raised?: Both parents Description of patient's relationship with caregiver when they were a child: "We get along really well" Patient's description of current relationship with people who raised him/her: "My father passed away in 2021/11/01and I get along well with my mother" How were you disciplined when you got in trouble as a child/adolescent?: Verbally and physically reprimanded WNL Does patient have siblings?: Yes Number of Siblings: 1 Description of patient's current relationship with siblings: "We get along really well" Did patient suffer any verbal/emotional/physical/sexual abuse as a child?: No Did patient suffer from severe childhood neglect?: No Has patient ever been sexually abused/assaulted/raped as an adolescent or adult?: No Was the patient ever a victim of a crime or a disaster?: No Witnessed domestic violence?: No Has patient been affected by domestic violence as an adult?: No  Education:  Highest grade of school patient has completed: 12th grade, Associate in [employer] Currently a Consulting civil engineer?: No Learning disability?: No  Employment/Work Situation:   Employment Situation: Employed 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?: No  Financial Resources:   Financial resources: Income from employment, Private insurance Does patient have a Lawyer  or guardian?: No  Alcohol/Substance Abuse:   What has been your use of drugs/alcohol within the last 12 months?: Benzodiazapine Dependence If attempted suicide, did drugs/alcohol play a role in this?:  (n/a) Alcohol/Substance Abuse Treatment Hx: Past detox Has alcohol/substance abuse ever caused legal problems?: No  Social Support System:   Conservation officer, nature Support System: Fair Development worker, community Support System: Mother and friends Type of faith/religion: "I try to be a [place]" How does patient's faith help to cope with current illness?: pt denies  Leisure/Recreation:   Do You Have Hobbies?: No Leisure and Hobbies: States he spends time with his daughter.  Strengths/Needs:   What is the patient's perception of their strengths?: "I am a hard worker and dependable." Patient states they can use these personal strengths during their treatment to contribute to their recovery: Pt endorsed Patient states these barriers may affect/interfere with their treatment: none reported Patient states these barriers may affect their return to the community: none reported Other important information patient would like considered in planning for their treatment: none reported  Discharge Plan:   Currently receiving community mental health services: No Patient states concerns and preferences for aftercare planning are: Patient interested in therapy and med mgmt, though expressed concern over cost and whether ins will cover. Patient states they will know when they are safe and ready for discharge when: "when I get some sleep" Does patient have access to transportation?: Yes Does patient have financial barriers related to discharge medications?: No Patient description of barriers related to discharge medications: [employer] ins.  Summary/Recommendations:   Summary and Recommendations (to be completed by the evaluator): 40 y/o male w/ dx of GAD, hx of MDD and Benzodiazepine dependence. Patient is readmitted  to [facility] in <30days. Admitted due to insomnia since 9/17. Pt states he has gotten little to no sleep since being discharged from [facility]. Reports issues sleeping began w/ he began detox from rx benzodiazapine use. Pt is concerned that benzodiazapine use has permanently impacted his ability to sleep contributing to ongoing anxiety. Denies SI/HI, reports hallucinations though reports "I think it is my own voice" linked to insomnia. Patient reports memory and concentration impairment related to insomnia. Orriented to person, place, time, and situation. Theraputic recomendations include crisis stabilization, med mgnt, encouraged group therapy, and individualized case mgnt.  Corky Crafts. 08/30/2021

## 2021-08-31 LAB — LIPID PANEL
Cholesterol: 188 mg/dL (ref 0–200)
HDL: 47 mg/dL (ref >40)
LDL Cholesterol: 125 mg/dL — ABNORMAL HIGH (ref 0–99)
Total CHOL/HDL Ratio: 4 RATIO
Triglycerides: 82 mg/dL (ref <150)
VLDL: 16 mg/dL (ref 0–40)

## 2021-08-31 LAB — TSH: TSH: 0.825 u[IU]/mL (ref 0.350–4.500)

## 2021-08-31 MED ORDER — PREGABALIN 50 MG PO CAPS: 50.0000 mg | ORAL_CAPSULE | Freq: Every day | ORAL | 0 refills | Status: DC

## 2021-08-31 MED ORDER — ADULT MULTIVITAMIN W/MINERALS CH
1.0000 | ORAL_TABLET | Freq: Every day | ORAL | Status: DC
  Filled 2021-08-31 (×2): qty 1

## 2021-08-31 MED ORDER — QUETIAPINE FUMARATE 50 MG PO TABS: 50.0000 mg | ORAL_TABLET | Freq: Every day | ORAL | 0 refills | Status: DC

## 2021-08-31 MED ORDER — NICOTINE POLACRILEX 2 MG MT GUM: 2.0000 mg | CHEWING_GUM | OROMUCOSAL | 0 refills | Status: AC | PRN

## 2021-08-31 NOTE — Progress Notes (Signed)
Pt discharged to lobby. Pt was stable and appreciative at that time. All papers and prescriptions were given and valuables returned. Verbal understanding expressed. Denies SI/HI and A/VH. Pt given opportunity to express concerns and ask questions.  

## 2021-08-31 NOTE — Group Note (Signed)
LCSW Group Therapy Note  Group Date: 08/31/2021 Start Time: 1100 End Time: 1200   Type of Therapy and Topic:  Group Therapy: Anger Cues and Responses  Participation Level:  Minimal   Description of Group:   In this group, patients learned how to recognize the physical, cognitive, emotional, and behavioral responses they have to anger-provoking situations.  They identified a recent time they became angry and how they reacted.  They analyzed how their reaction was possibly beneficial and how it was possibly unhelpful.  The group discussed a variety of healthier coping skills that could help with such a situation in the future.  Focus was placed on how helpful it is to recognize the underlying emotions to our anger, because working on those can lead to a more permanent solution as well as our ability to focus on the important rather than the urgent.  Therapeutic Goals: Patients will remember their last incident of anger and how they felt emotionally and physically, what their thoughts were at the time, and how they behaved. Patients will identify how their behavior at that time worked for them, as well as how it worked against them. Patients will explore possible new behaviors to use in future anger situations. Patients will learn that anger itself is normal and cannot be eliminated, and that healthier reactions can assist with resolving conflict rather than worsening situations.  Summary of Patient Progress:  Group was supplemented with worksheets due to limited staffing.   Therapeutic Modalities:   Cognitive Behavioral Therapy    Destenie Ingber A Eryk Beavers, LCSW 08/31/2021  11:43 AM   

## 2021-08-31 NOTE — [facility] Suicide Risk Assessment (Signed)
[facility] Discharge Suicide Risk Assessment   Principal Problem: GAD (generalized anxiety disorder) Discharge Diagnoses: Principal Problem:   GAD (generalized anxiety disorder)   Total Time spent with patient: 30 minutes  Musculoskeletal: Strength & Muscle Tone: within normal limits Gait & Station: normal Patient leans: N/A  Psychiatric Specialty Exam  Presentation  General Appearance: Appropriate for Environment; Casual; Fairly Groomed  Eye Contact:Good  Speech:Clear and Coherent; Normal Rate  Speech Volume:Normal  Handedness:Right   Mood and Affect  Mood:Anxious  Duration of Depression Symptoms: Greater than two weeks  Affect:Congruent   Thought Process  Thought Processes:Coherent; Goal Directed  Descriptions of Associations:Intact  Orientation:Full (Time, Place and Person)  Thought Content:Logical  History of Schizophrenia/Schizoaffective disorder:No  Duration of Psychotic Symptoms:N/A  Hallucinations:Hallucinations: None  Ideas of Reference:None  Suicidal Thoughts:Suicidal Thoughts: No SI Passive Intent and/or Plan: Without Intent; Without Plan  Homicidal Thoughts:Homicidal Thoughts: No   Sensorium  Memory:Immediate Fair; Recent Fair  Judgment:Fair  Insight:Fair   Executive Functions  Concentration:Good  Attention Span:Good  Recall:Fair  Fund of Knowledge:Fair  Language:Good   Psychomotor Activity  Psychomotor Activity:Psychomotor Activity: Normal   Assets  Assets:Communication Skills; Desire for Improvement; Physical Health; Resilience   Sleep  Sleep:Sleep: Fair Number of Hours of Sleep: 5.75   Physical Exam: Physical Exam Vitals and nursing note reviewed.  Constitutional:      Appearance: Normal appearance.  HENT:     Head: Normocephalic.     Nose: Nose normal.  Eyes:     Extraocular Movements: Extraocular movements intact.  Musculoskeletal:     Cervical back: Normal range of motion.  Neurological:     General:  No focal deficit present.     Mental Status: He is alert and oriented to person, place, and time.  Psychiatric:        Attention and Perception: Attention normal.        Mood and Affect: Mood is anxious.        Speech: Speech normal.        Behavior: Behavior normal. Behavior is cooperative.        Thought Content: Thought content normal.        Cognition and Memory: Cognition normal.        Judgment: Judgment normal.   Review of Systems  Respiratory:  Negative for cough.   Gastrointestinal:  Negative for constipation, diarrhea, nausea and vomiting.  Genitourinary:  Negative for dysuria.  Skin:  Negative for rash.  Neurological:  Negative for dizziness and headaches.  Psychiatric/Behavioral:  Negative for depression, hallucinations, substance abuse and suicidal ideas. The patient is nervous/anxious.   Blood pressure (!) 144/98, pulse (!) 114, temperature 98 F (36.7 C), temperature source Oral, resp. rate 20, height 5\' 10"  (1.778 m), weight 59.4 kg, SpO2 94 %. Body mass index is 18.8 kg/m.  Mental Status Per Nursing Assessment::   On Admission:  Suicidal ideation indicated by patient  Demographic Factors:  Male, Divorced or widowed, Caucasian, and Living alone  Loss Factors: NA  Historical Factors: NA  Risk Reduction Factors:   Employed  Continued Clinical Symptoms:  Alcohol/Substance Abuse/Dependencies Previous Psychiatric Diagnoses and Treatments  Cognitive Features That Contribute To Risk:  Thought constriction (tunnel vision)    Suicide Risk:  Mild:  Suicidal ideation of limited frequency, intensity, duration, and specificity.  There are no identifiable plans, no associated intent, mild dysphoria and related symptoms, good self-control (both objective and subjective assessment), few other risk factors, and identifiable protective factors, including available and accessible social support.  Follow-up Information     [facility]. Go in  2 week(s).   Specialty: Behavioral Health Why: Walk in hours are 8-11 AM Contact information: [address] [place] [address] [phone]                Plan Of Care/Follow-up recommendations:  Activity:  as tolerate Diet:  regular Other:    Prescriptions for new medications provided for the patient to bridge to follow up appointment. The patient was informed that refills for these prescriptions are generally not provided, and patient is encouraged to attend all follow up appointments to address medication refills and adjustments.   Today's discharge was reviewed with treatment team, and the team is in agreement that the patient is ready for discharge. The patient is was of the discharge plan for today and has been given opportunity to ask questions. At time of discharge, the patient does not vocalize any acute harm to self or others, is goal directed, able to advocate for self and organizational baseline.   At discharge, the patient is instructed to:  Take all medications as prescribed. Report any adverse effects and or reactions from the medicines to her outpatient provider promptly.  Do not engage in alcohol and/or illegal drug use while on prescription medicines.  In the event of worsening symptoms, patient is instructed to call the crisis hotline, 911 and or go to the nearest ED for appropriate evaluation and treatment of symptoms.  Follow-up with primary care provider for further care of medical issues, concerns and or health care needs.   Roselle Locus, MD 08/31/2021, 3:00 PM

## 2021-08-31 NOTE — Progress Notes (Signed)
Patient signed a 72 hr request for discharge today at 10:10

## 2021-08-31 NOTE — Discharge Summary (Signed)
Physician Discharge Summary Note  Patient:  Brian Thomas is an 40 y.o., male MRN:  [id number] DOB:  20-Feb-1981 Patient phone:  [phone] (home)  Patient address:   [address] [place] [address],  Total Time spent with patient: 30 minutes  Date of Admission:  08/30/2021 Date of Discharge: 08/31/2021  Reason for Admission:     Brian Thomas is a 40 yr old male with a PPHx significant for MDD, GAD, Benzo Dependence and hospitalized at [facility] 07/2021. He was admitted for anxiety and insomnia.  He reported that he had not slept for about 5 days. He was also very anxious because he was afraid of loosing his job and loosing everything.   Hospital Course:   During hospitalization, patient was evaluated by the psychiatric team.  They received multiple disciplinary care.  After initial intake evaluation, it was decided to adjust medications.  He was restarted on Seroquel which he had been placed on before but declined to continue taking it at that time.  He reported significant improvement after taking it. He was started on Lyrica for his restless leg syndrome and reported improvement of his symptoms. Patient not having side effects to psychiatric medications.   Patient received supportive psychotherapy and was encouraged to participate in group therapy during the hospitalization. Patient denied having suicidal thoughts more than 24 hours prior to discharge.    On day of discharge he reports that he is doing well.  He reports that he slept well last night.  He reports that he is eating and drinking well.  He reports no SI, HI, or AVH.   He reports that he was able to sleep through the night without issue.  He reports that since he was able to sleep his anxiety has improved.  He reports no side effects from his medications.  He requests to leave today because he reports needing to go to work tomorrow.  Recommended that he stay one more night and he could discharge early  tomorrow morning but he insists he needs to be discharged today.  He reports that he has been out of work since mid-September.  He reports that he needs to get back to work to ensure he can keep his job.    Discussed that if he has further crisis he should return to [facility], go to [facility], go to the nearest ED, or call 911. He was discharged home.    Principal Problem: GAD (generalized anxiety disorder) Discharge Diagnoses: Principal Problem:   GAD (generalized anxiety disorder)   Past Psychiatric History: MDD, GAD, Benzo Dependence, and 2 Previous hospitalizations  Past Medical History:  Past Medical History:  Diagnosis Date   Pneumonia    History reviewed. No pertinent surgical history. Family History: History reviewed. No pertinent family history. Family Psychiatric  History: None Social History:  Social History   Substance and Sexual Activity  Alcohol Use No     Social History   Substance and Sexual Activity  Drug Use Yes   Comment: takes Delta 8 last use Friday 08/01/21    Social History   Socioeconomic History   Marital status: Divorced    Spouse name: Not on file   Number of children: 1   Years of education: Not on file   Highest education level: Not on file  Occupational History   Not on file  Tobacco Use   Smoking status: Every Day    Packs/day: 1.00    Years: 20.00    Pack years: 20.00  Types: Cigarettes   Smokeless tobacco: Never  Vaping Use   Vaping Use: Never used  Substance and Sexual Activity   Alcohol use: No   Drug use: Yes    Comment: takes Delta 8 last use Friday 08/01/21   Sexual activity: Not Currently  Other Topics Concern   Not on file  Social History Narrative   Not on file   Social Determinants of Health   Financial Resource Strain: Not on file  Food Insecurity: Not on file  Transportation Needs: Not on file  Physical Activity: Not on file  Stress: Not on file  Social Connections: Not on file     Physical  Findings:  Musculoskeletal: Strength & Muscle Tone: within normal limits Gait & Station: normal Patient leans: N/A   Psychiatric Specialty Exam:  Presentation  General Appearance: Appropriate for Environment; Casual; Fairly Groomed  Eye Contact:Good  Speech:Clear and Coherent; Normal Rate  Speech Volume:Normal  Handedness:Right   Mood and Affect  Mood:Anxious  Affect:Congruent   Thought Process  Thought Processes:Coherent; Goal Directed  Descriptions of Associations:Intact  Orientation:Full (Time, Place and Person)  Thought Content:Logical  History of Schizophrenia/Schizoaffective disorder:No  Duration of Psychotic Symptoms:N/A  Hallucinations:Hallucinations: None  Ideas of Reference:None  Suicidal Thoughts:Suicidal Thoughts: No SI Passive Intent and/or Plan: Without Intent; Without Plan  Homicidal Thoughts:Homicidal Thoughts: No   Sensorium  Memory:Immediate Fair; Recent Fair  Judgment:Fair  Insight:Fair   Executive Functions  Concentration:Good  Attention Span:Good  Recall:Fair  Fund of Knowledge:Fair  Language:Good   Psychomotor Activity  Psychomotor Activity:Psychomotor Activity: Normal   Assets  Assets:Communication Skills; Desire for Improvement; Physical Health; Resilience   Sleep  Sleep:Sleep: Fair Number of Hours of Sleep: 5.75    Physical Exam: Physical Exam Vitals and nursing note reviewed.  Constitutional:      General: He is not in acute distress.    Appearance: Normal appearance. He is normal weight. He is not ill-appearing or toxic-appearing.  HENT:     Head: Normocephalic and atraumatic.  Pulmonary:     Effort: Pulmonary effort is normal.  Musculoskeletal:        General: Normal range of motion.  Neurological:     General: No focal deficit present.     Mental Status: He is alert.   Review of Systems  Respiratory:  Negative for cough and shortness of breath.   Cardiovascular:  Negative for chest  pain.  Gastrointestinal:  Negative for abdominal pain, constipation, diarrhea, nausea and vomiting.  Neurological:  Negative for weakness and headaches.  Psychiatric/Behavioral:  Negative for depression, hallucinations and suicidal ideas. The patient does not have insomnia.   Blood pressure (!) 144/98, pulse (!) 114, temperature 98 F (36.7 C), temperature source Oral, resp. rate 20, height 5\' 10"  (1.778 m), weight 59.4 kg, SpO2 94 %. Body mass index is 18.8 kg/m.   Social History   Tobacco Use  Smoking Status Every Day   Packs/day: 1.00   Years: 20.00   Pack years: 20.00   Types: Cigarettes  Smokeless Tobacco Never   Tobacco Cessation:  A prescription for an FDA-approved tobacco cessation medication provided at discharge   Blood Alcohol level:  Lab Results  Component Value Date   [facility] <10 08/30/2021   ETH <10 08/19/2021    Metabolic Disorder Labs:  Lab Results  Component Value Date   HGBA1C 4.6 (L) 08/06/2021   MPG 85.32 08/06/2021   No results found for: PROLACTIN Lab Results  Component Value Date   CHOL 188 08/31/2021  TRIG 82 08/31/2021   HDL 47 08/31/2021   CHOLHDL 4.0 08/31/2021   VLDL 16 08/31/2021   LDLCALC 125 (H) 08/31/2021   LDLCALC 116 (H) 08/06/2021    See Psychiatric Specialty Exam and Suicide Risk Assessment completed by Attending Physician prior to discharge.  Discharge destination:  Home  Is patient on multiple antipsychotic therapies at discharge:  No   Has Patient had three or more failed trials of antipsychotic monotherapy by history:  No  Recommended Plan for Multiple Antipsychotic Therapies: NA   Prescriptions given at discharge. Patient agreeable to plan. Given opportunity to ask questions. Appears to feel comfortable with discharge denies any current suicidal or homicidal thought.  Patient is also instructed prior to discharge to: Take all medications as prescribed by mental healthcare provider. Report any adverse effects and or  reactions from the medicines to outpatient provider promptly. Patient has been instructed & cautioned: To not engage in alcohol and or illegal drug use while on prescription medicines. In the event of worsening symptoms,  patient is instructed to call the crisis hotline, 911 and or go to the nearest ED for appropriate evaluation and treatment of symptoms. To follow-up with primary care provider for other medical issues, concerns and or health care needs  The patient was evaluated each day by a clinical provider to ascertain response to treatment. Improvement was noted by the patient's report of decreasing symptoms, improved sleep and appetite, affect, medication tolerance, behavior, and participation in unit programming.  Patient was asked each day to complete a self inventory noting mood, mental status, pain, new symptoms, anxiety and concerns.  Patient responded well to medication and being in a therapeutic and supportive environment. Positive and appropriate behavior was noted and the patient was motivated for recovery. The patient worked closely with the treatment team and case manager to develop a discharge plan with appropriate goals. Coping skills, problem solving as well as relaxation therapies were also part of the unit programming.  By the day of discharge patient was in much improved condition than upon admission.  Symptoms were reported as significantly decreased or resolved completely. The patient denied SI/HI and voiced no AVH. The patient was motivated to continue taking medication with a goal of continued improvement in mental health.   Patient was discharged home with a plan to follow up as noted below.    Discharge Instructions     Diet - low sodium heart healthy   Complete by: As directed    Increase activity slowly   Complete by: As directed       Allergies as of 08/31/2021   No Known Allergies      Medication List     STOP taking these medications    clonazePAM 0.25  MG disintegrating tablet Commonly known as: KLONOPIN   clonazePAM 0.5 MG tablet Commonly known as: KlonoPIN   sertraline 25 MG tablet Commonly known as: ZOLOFT   traZODone 50 MG tablet Commonly known as: DESYREL       TAKE these medications      Indication  nicotine polacrilex 2 MG gum Commonly known as: NICORETTE Take 1 each (2 mg total) by mouth as needed for up to 10 days for smoking cessation.  Indication: Nicotine Addiction   pregabalin 50 MG capsule Commonly known as: LYRICA Take 1 capsule (50 mg total) by mouth at bedtime.  Indication: Generalized Anxiety Disorder, Restless Leg Syndrome   QUEtiapine 50 MG tablet Commonly known as: SEROQUEL Take 1 tablet (50 mg total)  by mouth at bedtime. What changed:  medication strength how much to take  Indication: Insomnia        Follow-up Information     [facility]. Go in 2 week(s).   Specialty: Behavioral Health Why: Walk in hours are 8-11 AM Contact information: [address] [place] [address] [phone]                Follow-up recommendations:  - Activity as tolerated. - Diet as recommended by PCP. - Keep all scheduled follow-up appointments as recommended.   Comments: Patient is instructed to take all prescribed medications as recommended. Report any side effects or adverse reactions to your outpatient psychiatrist. Patient is instructed to abstain from alcohol and illegal drugs while on prescription medications. In the event of worsening symptoms, patient is instructed to call the crisis hotline, 911, or go to the nearest emergency department for evaluation and treatment.  Signed: Lauro Franklin, MD 08/31/2021, 2:07 PM

## 2021-08-31 NOTE — Progress Notes (Addendum)
   08/31/21 1000  Psych Admission Type (Psych Patients Only)  Admission Status Voluntary  Psychosocial Assessment  Patient Complaints Anxiety  Eye Contact Fair  Facial Expression Animated  Affect Anxious  Speech Logical/coherent  Interaction Assertive  Motor Activity Fidgety;Pacing  Appearance/Hygiene Unremarkable  Behavior Characteristics Cooperative;Appropriate to situation  Mood Anxious  Thought Process  Coherency WDL  Content WDL  Delusions None reported or observed  Perception WDL  Hallucination None reported or observed  Judgment Impaired  Confusion None  Danger to Self  Current suicidal ideation? Denies  Danger to Others  Danger to Others None reported or observed  D. Pt presented less anxious today with a more rested appearance. Pt reported having better sleep last night. Per pt's self inventory, pt rated his depression, hopelessness and anxiety all 0's. Pt reported that his goal was to work on "getting back to my life". Pt currently denies SI/HI and AVH.  A. Labs and vitals monitored. Pt given and educated on medications. Pt supported emotionally and encouraged to express concerns and ask questions.   R. Pt remains safe with 15 minute checks. Will continue POC.

## 2021-08-31 NOTE — [facility] Group Notes (Signed)
The focus of this group is to help patients establish daily goals to achieve during treatment and discuss how the patient can incorporate goal setting into their daily lives to aide in recovery. 

## 2021-09-01 DIAGNOSIS — R251 Tremor, unspecified: Secondary | ICD-10-CM | POA: Diagnosis not present

## 2021-09-01 DIAGNOSIS — R0789 Other chest pain: Secondary | ICD-10-CM | POA: Diagnosis not present

## 2021-09-01 DIAGNOSIS — F17290 Nicotine dependence, other tobacco product, uncomplicated: Secondary | ICD-10-CM | POA: Diagnosis not present

## 2021-09-01 DIAGNOSIS — R69 Illness, unspecified: Secondary | ICD-10-CM | POA: Diagnosis not present

## 2021-09-01 DIAGNOSIS — R000 Tachycardia, unspecified: Secondary | ICD-10-CM | POA: Diagnosis not present

## 2021-09-01 NOTE — Progress Notes (Signed)
  [facility] Adult Case Management Discharge Plan :  Will you be returning to the same living situation after discharge:  Yes,  home At discharge, do you have transportation home?: Yes,  bus passes Do you have the ability to pay for your medications: Yes,  has insurance  Release of information consent forms completed and in the chart;  Patient's signature needed at discharge.  Patient to Follow up at:  Follow-up Information     [facility]. Go in 2 week(s).   Specialty: Behavioral Health Why: Walk in hours are 8-11 AM Contact information: [address] [place] [address] [phone]                Next level of care provider has access to [facility] Link:no  Safety Planning and Suicide Prevention discussed: Yes,  with pt     Has patient been referred to the Quitline?: Patient refused referral  Patient has been referred for addiction treatment: Pt. refused referral  Otelia Santee, LCSW 09/01/2021, 8:39 AM

## 2021-09-05 DIAGNOSIS — G4700 Insomnia, unspecified: Secondary | ICD-10-CM | POA: Diagnosis not present

## 2021-09-08 DIAGNOSIS — G4700 Insomnia, unspecified: Secondary | ICD-10-CM | POA: Diagnosis not present

## 2021-09-19 DIAGNOSIS — R69 Illness, unspecified: Secondary | ICD-10-CM | POA: Diagnosis not present

## 2021-09-19 DIAGNOSIS — F410 Panic disorder [episodic paroxysmal anxiety] without agoraphobia: Secondary | ICD-10-CM | POA: Diagnosis not present

## 2021-10-10 DIAGNOSIS — F411 Generalized anxiety disorder: Secondary | ICD-10-CM | POA: Diagnosis not present

## 2021-10-10 DIAGNOSIS — R69 Illness, unspecified: Secondary | ICD-10-CM | POA: Diagnosis not present

## 2021-10-10 DIAGNOSIS — F410 Panic disorder [episodic paroxysmal anxiety] without agoraphobia: Secondary | ICD-10-CM | POA: Diagnosis not present

## 2022-01-09 DIAGNOSIS — F411 Generalized anxiety disorder: Secondary | ICD-10-CM | POA: Diagnosis not present

## 2022-01-09 DIAGNOSIS — R69 Illness, unspecified: Secondary | ICD-10-CM | POA: Diagnosis not present

## 2022-04-03 DIAGNOSIS — Z87891 Personal history of nicotine dependence: Secondary | ICD-10-CM | POA: Diagnosis not present

## 2022-04-03 DIAGNOSIS — Z1331 Encounter for screening for depression: Secondary | ICD-10-CM | POA: Diagnosis not present

## 2022-04-03 DIAGNOSIS — Z6820 Body mass index (BMI) 20.0-20.9, adult: Secondary | ICD-10-CM | POA: Diagnosis not present

## 2022-04-03 DIAGNOSIS — F411 Generalized anxiety disorder: Secondary | ICD-10-CM | POA: Diagnosis not present

## 2022-04-03 DIAGNOSIS — Z720 Tobacco use: Secondary | ICD-10-CM | POA: Diagnosis not present

## 2022-04-03 DIAGNOSIS — F330 Major depressive disorder, recurrent, mild: Secondary | ICD-10-CM | POA: Diagnosis not present

## 2022-04-03 DIAGNOSIS — R69 Illness, unspecified: Secondary | ICD-10-CM | POA: Diagnosis not present

## 2022-04-03 DIAGNOSIS — Z0000 Encounter for general adult medical examination without abnormal findings: Secondary | ICD-10-CM | POA: Diagnosis not present

## 2022-04-24 DIAGNOSIS — R319 Hematuria, unspecified: Secondary | ICD-10-CM | POA: Diagnosis not present

## 2022-10-02 ENCOUNTER — Ambulatory Visit: Payer: No Typology Code available for payment source | Admitting: Internal Medicine

## 2022-10-02 ENCOUNTER — Encounter: Payer: Self-pay | Admitting: Internal Medicine

## 2022-10-02 VITALS — BP 138/80 | HR 80 | Temp 98.0°F | Resp 16 | Ht 70.0 in | Wt 150.4 lb

## 2022-10-02 DIAGNOSIS — F330 Major depressive disorder, recurrent, mild: Secondary | ICD-10-CM

## 2022-10-02 DIAGNOSIS — F411 Generalized anxiety disorder: Secondary | ICD-10-CM | POA: Diagnosis not present

## 2022-10-02 DIAGNOSIS — R69 Illness, unspecified: Secondary | ICD-10-CM | POA: Diagnosis not present

## 2022-10-02 MED ORDER — SERTRALINE HCL 50 MG PO TABS: 50.0000 mg | ORAL_TABLET | Freq: Every day | ORAL | 1 refills | Status: DC

## 2022-10-02 MED ORDER — QUETIAPINE FUMARATE 200 MG PO TABS: 200.0000 mg | ORAL_TABLET | Freq: Every day | ORAL | 1 refills | Status: DC

## 2022-10-02 NOTE — Assessment & Plan Note (Signed)
He is on seroquel for both his anxiety and depression and uses hydroxyzine as needed for panic attacks.  His anxiety seems to be well controlled.  He has self titrated his seroquel dose downward and is currently on seroquel 200mg  qhs.  I am fine with this as it is controlling his symptoms.

## 2022-10-02 NOTE — Progress Notes (Signed)
Office Visit  Subjective   Patient ID: Brian Thomas   DOB: 29-Jun-1981   Age: 41 y.o.   MRN: [id number]   Chief Complaint Chief Complaint  Patient presents with   Follow-up     History of Present Illness  The patient is a 41 yo male who returns today for followup of his depression and anxiety.  Since his last visit, there has been no change to his depression and anxiety.  He again, states that his depression is "basically gone" and his anxiety is mild.  He denies any panic attacks.  He is now only taking Seroquel 400mg  at night and has not taken his hydroxyzine for several months.  He admits he is cutting his seroquel to 200mg  qhs and not having any problems.  His sleeping well. Today, he states he is not having any problems with sleep.   The patient has a diagnosis of anxiety, depression, OCD and schizophrenia. He is not having problems with his schizophrenia.      Past Medical History Past Medical History:  Diagnosis Date   Anxiety    Depression    Pneumonia      Allergies No Known Allergies   Review of Systems Review of Systems  Constitutional:  Negative for chills, malaise/fatigue and weight loss.  Eyes:  Negative for blurred vision and double vision.  Respiratory:  Negative for cough and shortness of breath.   Cardiovascular:  Negative for chest pain, palpitations and leg swelling.  Gastrointestinal:  Negative for constipation, diarrhea, nausea and vomiting.  Musculoskeletal:  Negative for myalgias.  Neurological:  Negative for dizziness, weakness and headaches.  Psychiatric/Behavioral:  Negative for depression, hallucinations, substance abuse and suicidal ideas. The patient is not nervous/anxious and does not have insomnia.        Objective:    Vitals BP 138/80 (BP Location: Right Arm, Patient Position: Sitting, Cuff Size: Normal)   Pulse 80   Temp 98 F (36.7 C) (Temporal)   Resp 16   Ht 5\' 10"  (1.778 m)   Wt 150 lb 6.4 oz (68.2 kg)   SpO2 98%   BMI 21.58  kg/m    Physical Examination Physical Exam Constitutional:      General: He is not in acute distress.    Appearance: Normal appearance. He is not ill-appearing.  Cardiovascular:     Rate and Rhythm: Normal rate and regular rhythm.     Pulses: Normal pulses.     Heart sounds: Normal heart sounds. No murmur heard.    No friction rub. No gallop.  Pulmonary:     Effort: Pulmonary effort is normal. No respiratory distress.     Breath sounds: Normal breath sounds. No wheezing, rhonchi or rales.  Abdominal:     General: Abdomen is flat. Bowel sounds are normal. There is no distension.     Palpations: Abdomen is soft.     Tenderness: There is no abdominal tenderness.  Musculoskeletal:     Right lower leg: No edema.     Left lower leg: No edema.  Neurological:     Mental Status: He is alert.  Psychiatric:        Mood and Affect: Mood normal.        Behavior: Behavior normal.        Thought Content: Thought content normal.        Assessment & Plan:   Mild episode of recurrent major depressive disorder (HCC) His depression is currently doing well and controlled.  We will  continue his current dose of sertraline.  GAD (generalized anxiety disorder) He is on seroquel for both his anxiety and depression and uses hydroxyzine as needed for panic attacks.  His anxiety seems to be well controlled.  He has self titrated his seroquel dose downward and is currently on seroquel 200mg  qhs.  I am fine with this as it is controlling his symptoms.    Return in about 6 months (around 04/02/2023) for annual.    06/02/2023, MD

## 2022-10-02 NOTE — Assessment & Plan Note (Signed)
His depression is currently doing well and controlled.  We will continue his current dose of sertraline.

## 2023-02-11 IMAGING — CR DG CHEST 2V
2 series · 2 of 2 positions shown · non-contrast
Comparison: None.

CLINICAL DATA: Intermittent chest pain

EXAM:
CHEST - 2 VIEW

[chest pa]
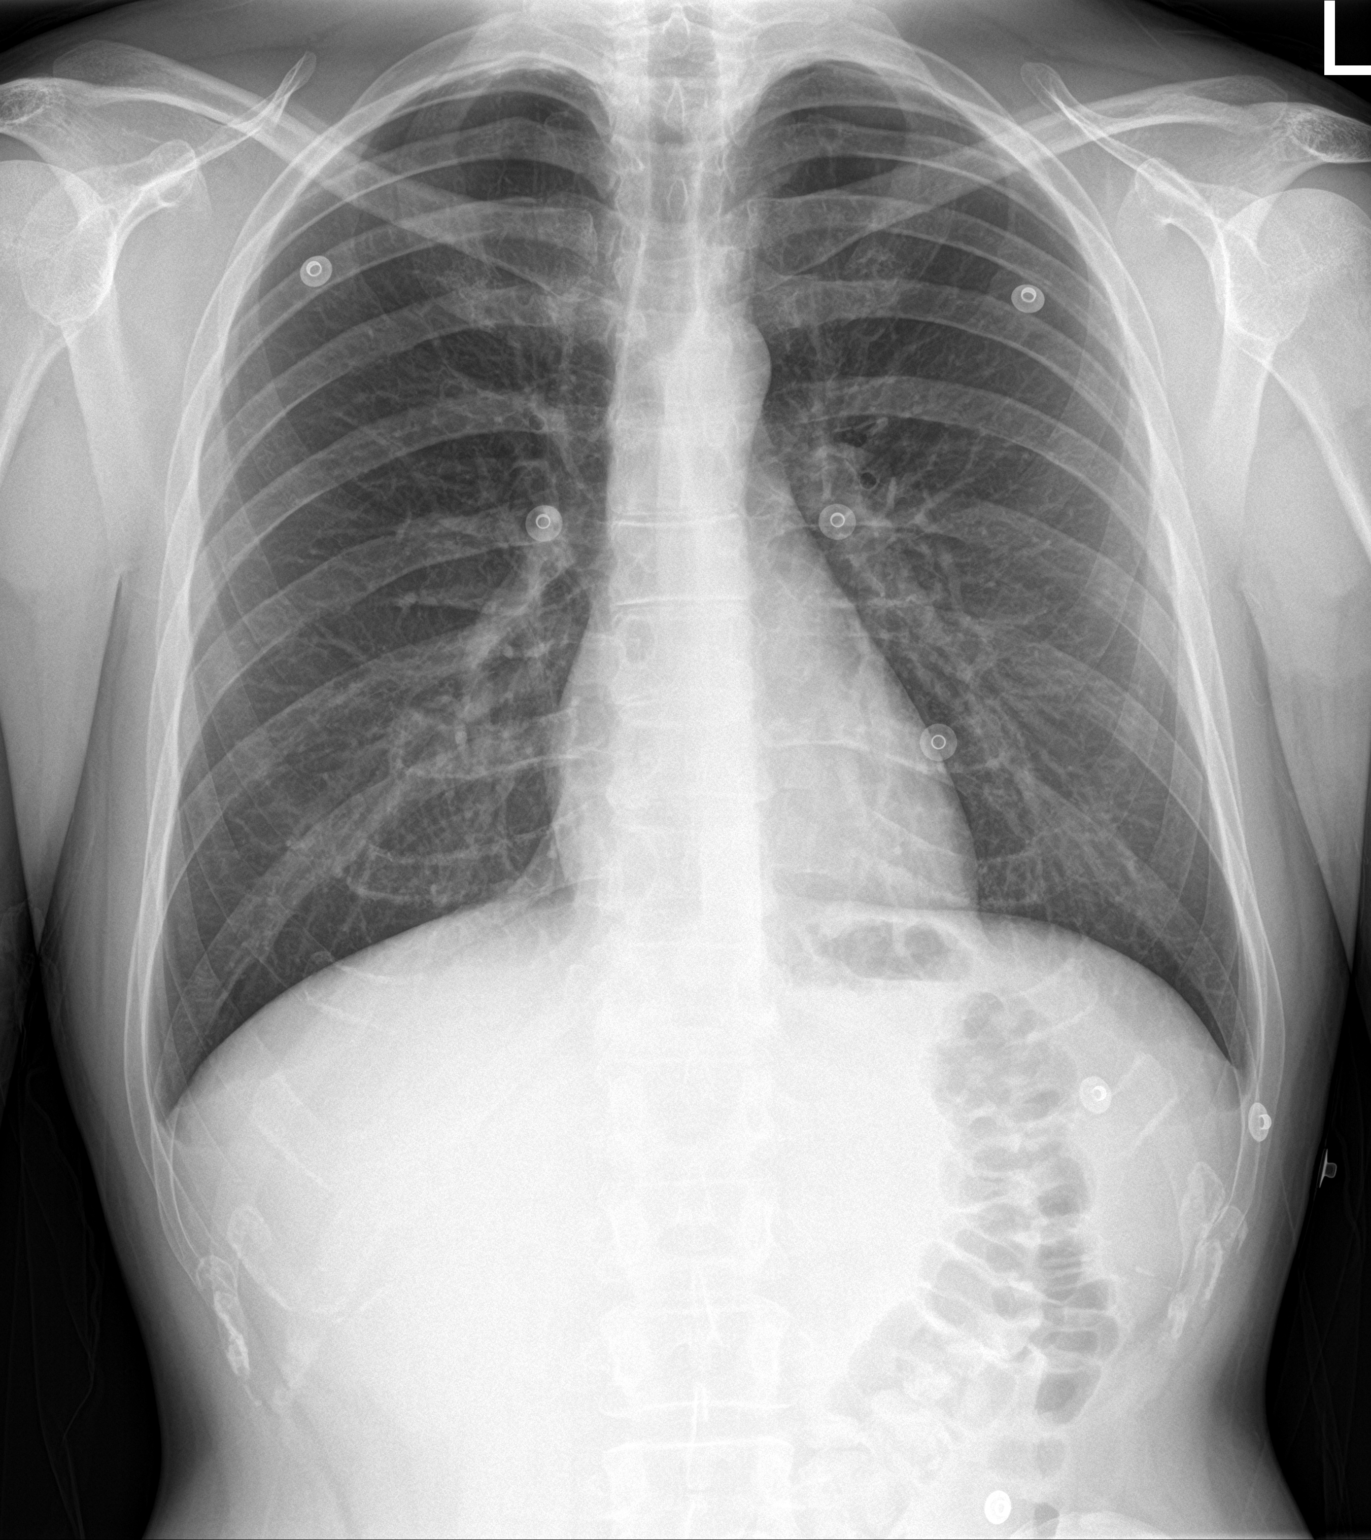

[chest lat]
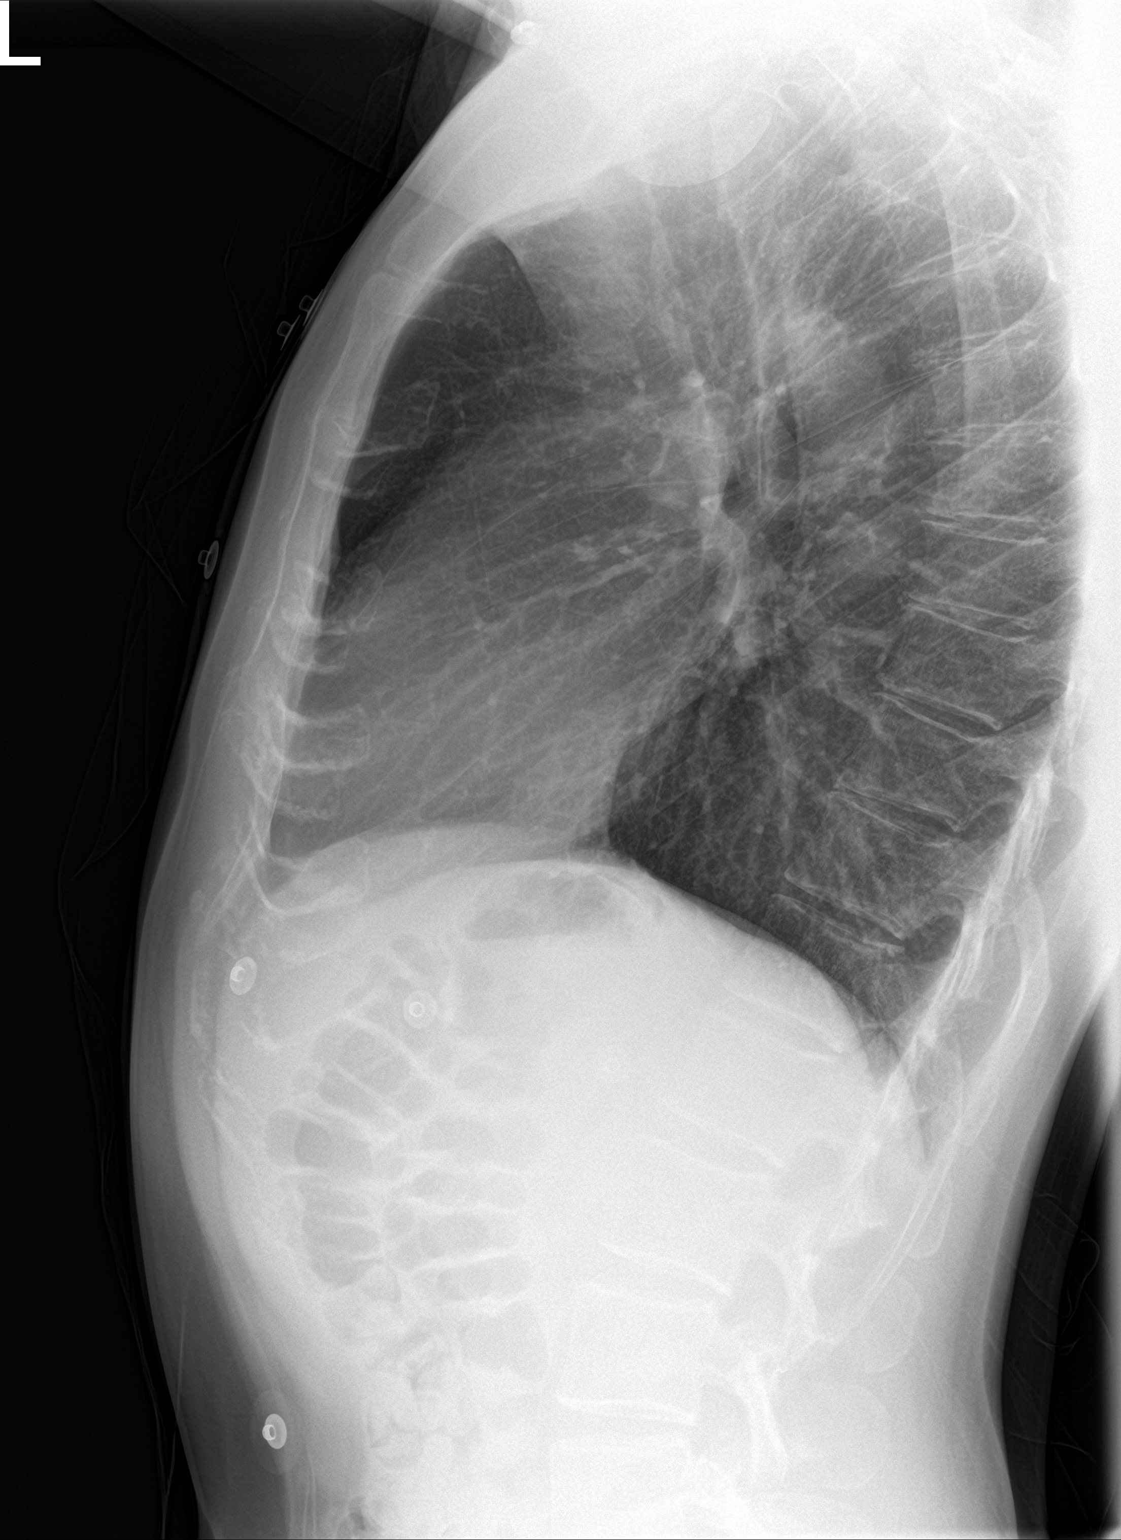

[2 of 2 positions shown; findings below may reference images not displayed]

FINDINGS: Normal mediastinum and cardiac silhouette. Normal pulmonary
vasculature. No evidence of effusion, infiltrate, or pneumothorax.
No acute bony abnormality.
IMPRESSION: No acute cardiopulmonary process.

## 2023-02-15 ENCOUNTER — Ambulatory Visit: Payer: No Typology Code available for payment source | Admitting: Internal Medicine

## 2023-02-15 ENCOUNTER — Encounter: Payer: Self-pay | Admitting: Internal Medicine

## 2023-02-15 VITALS — BP 138/80 | HR 90 | Temp 98.2°F | Resp 16 | Ht 70.0 in | Wt 154.8 lb

## 2023-02-15 DIAGNOSIS — F330 Major depressive disorder, recurrent, mild: Secondary | ICD-10-CM

## 2023-02-15 DIAGNOSIS — Z2839 Other underimmunization status: Secondary | ICD-10-CM

## 2023-02-15 NOTE — Progress Notes (Signed)
 Office Visit  Subjective   Patient ID: Brian Thomas   DOB: 08-04-1981   Age: 42 y.o.   MRN: [id number]   Chief Complaint Chief Complaint  Patient presents with   Follow-up     History of Present Illness Brian Thomas returns today for followup of his anxiety and depression.  Since his last visit, Brian Thomas states his anxiety and depression are now gone.  Brian Thomas is now only taking Seroquel  200mg  at night where Brian Thomas was previously on 400mg  when I saw him in 03/2022.  Brian Thomas went down on this dose on his own since that time.  Brian Thomas used to be on hydroxyzine  but has not taken this in over a year.  His sleeping well and eating well.  The patient has a diagnosis of anxiety, depression, OCD and schizophrenia.  Brian Thomas states his OCD is doing well and Brian Thomas is not having problems with his schizophrenia.  Brian Thomas denies any auditory or visual hallucinations.  Brian Thomas has been on SSRI's in the past but states Brian Thomas never really took any of them and didn't give them an "honest shot".    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  Brian Thomas comes in today with paperwork to fill out.       Past Medical History Past Medical History:  Diagnosis Date   Anxiety    Depression    Tobacco abuse      Allergies Allergies  Allergen Reactions   Novocain [Procaine]     Tachycardia      Medications  Current Outpatient Medications:    sertraline  (ZOLOFT ) 25 MG tablet, One tab po daily x 30 days, then take one tab po every other day x 2 weeks then stop., Disp: 30 tablet, Rfl: 0   sertraline  (ZOLOFT ) 50 MG tablet, TAKE 1 TABLET BY MOUTH EVERY DAY, Disp: 90 tablet, Rfl: 1   Review of Systems Review of Systems  Constitutional:  Negative for chills and fever.  Respiratory:  Negative for cough, hemoptysis, sputum production, shortness of breath and wheezing.   Cardiovascular:  Negative for chest pain, palpitations and leg swelling.  Gastrointestinal:  Negative for abdominal  pain, constipation, diarrhea, nausea and vomiting.  Neurological:  Negative for dizziness, weakness and headaches.  Psychiatric/Behavioral:  Negative for depression. The patient is not nervous/anxious.        Objective:    Vitals BP 138/80   Pulse 90   Temp 98.2 F (36.8 C)   Resp 16   Ht 5\' 10"  (1.778 m)   Wt 154 lb 12.8 oz (70.2 kg)   SpO2 98%   BMI 22.21 kg/m    Physical Examination Physical Exam Constitutional:      Appearance: Normal appearance. Brian Thomas is not ill-appearing.  Cardiovascular:     Rate and Rhythm: Normal rate and regular rhythm.     Pulses: Normal pulses.     Heart sounds: No murmur heard.    No friction rub. No gallop.  Pulmonary:     Effort: Pulmonary effort is normal. No respiratory distress.     Breath sounds: No wheezing, rhonchi or rales.  Abdominal:     General: Abdomen is flat. Bowel sounds are normal. There is no distension.     Palpations: Abdomen is soft.     Tenderness: There is no abdominal tenderness.  Musculoskeletal:     Right lower leg: No edema.     Left lower leg: No edema.  Skin:    General: Skin is warm and dry.     Findings: No rash.  Neurological:     Mental Status: Brian Thomas is alert.        Assessment & Plan:   Incomplete immunization status We will obtain titers for MMR, Hep B and varicella and place a TB skin test today.  Mild episode of recurrent major depressive disorder (HCC) Brian Thomas is going down on his dose of seroquel  and is doing well.    No follow-ups on file.   Wayne Haines, MD

## 2023-02-16 LAB — HEPATITIS B SURFACE ANTIBODY,QUALITATIVE: Hep B Surface Ab, Qual: REACTIVE

## 2023-02-16 LAB — MEASLES/MUMPS/RUBELLA IMMUNITY
MUMPS ABS, IGG: 74.3 AU/mL (ref >10.9)
RUBEOLA AB, IGG: >300 AU/mL (ref >16.4)
Rubella Antibodies, IGG: 3.66 index (ref >0.99)

## 2023-02-16 LAB — VARICELLA ZOSTER ANTIBODY, IGG: Varicella zoster IgG: 3394 index (ref >165)

## 2023-02-16 LAB — HEPATITIS B SURFACE ANTIGEN: Hepatitis B Surface Ag: NEGATIVE

## 2023-04-09 ENCOUNTER — Encounter: Payer: Self-pay | Admitting: Internal Medicine

## 2023-04-14 ENCOUNTER — Other Ambulatory Visit: Payer: Self-pay | Admitting: Internal Medicine

## 2023-05-07 ENCOUNTER — Encounter: Payer: Self-pay | Admitting: Internal Medicine

## 2023-09-13 ENCOUNTER — Other Ambulatory Visit: Payer: Self-pay | Admitting: Internal Medicine

## 2023-09-20 ENCOUNTER — Ambulatory Visit: Payer: No Typology Code available for payment source | Admitting: Internal Medicine

## 2023-09-20 ENCOUNTER — Encounter: Payer: Self-pay | Admitting: Internal Medicine

## 2023-09-20 VITALS — BP 120/80 | HR 86 | Temp 98.0°F | Resp 18 | Ht 70.0 in | Wt 161.0 lb

## 2023-09-20 DIAGNOSIS — F330 Major depressive disorder, recurrent, mild: Secondary | ICD-10-CM

## 2023-09-20 DIAGNOSIS — F411 Generalized anxiety disorder: Secondary | ICD-10-CM | POA: Diagnosis not present

## 2023-09-20 MED ORDER — SERTRALINE HCL 25 MG PO TABS: ORAL_TABLET | ORAL | 0 refills | Status: AC

## 2023-09-20 NOTE — Progress Notes (Signed)
Office Visit  Subjective   Patient ID: Brian Thomas   DOB: [birth year]   Age: 42 y.o.   MRN: [id number]   Chief Complaint Chief Complaint  Patient presents with   Follow-up     History of Present Illness [name] returns today for followup of his anxiety and depression.  I last saw him about 7 months ago where at that time he told me that his  anxiety and depression "was gone".  He remains on seroquel wher he has continued to cut this down and seroquel 100mg  at bedtime.  He remains on zoloft 50mg  daily as well.  He used to be on hydroxyzine but has not taken this in over a year.  His sleeping well and eating well.  The patient has a diagnosis of anxiety, depression, OCD and schizophrenia.  He states his OCD is doing well and he is not having problems with his schizophrenia.  He denies any auditory or visual hallucinations.  He has been on SSRI's in the past but states he never really took any of them and didn't give them an "honest shot".       Past Medical History Past Medical History:  Diagnosis Date   Anxiety    Depression    Tobacco abuse      Allergies Allergies  Allergen Reactions   Novocain [Procaine]     Tachycardia      Medications  Current Outpatient Medications:    sertraline (ZOLOFT) 50 MG tablet, TAKE 1 TABLET BY MOUTH EVERY DAY, Disp: 90 tablet, Rfl: 1   Review of Systems Review of Systems  Constitutional:  Negative for chills and fever.  Cardiovascular:  Negative for chest pain and palpitations.  Gastrointestinal:  Negative for abdominal pain, constipation, diarrhea, nausea and vomiting.  Neurological:  Negative for dizziness, weakness and headaches.  Psychiatric/Behavioral:  Negative for depression, hallucinations and substance abuse. The patient is not nervous/anxious and does not have insomnia.        Objective:    Vitals BP 120/80   Pulse 86   Temp 98 F (36.7 C)   Resp 18   Ht 5\' 10"  (1.778 m)   Wt 161 lb (73 kg)   SpO2 98%   BMI 23.10  kg/m    Physical Examination Physical Exam Constitutional:      Appearance: Normal appearance. He is not ill-appearing.  Cardiovascular:     Rate and Rhythm: Normal rate and regular rhythm.     Pulses: Normal pulses.     Heart sounds: No murmur heard.    No friction rub. No gallop.  Pulmonary:     Effort: Pulmonary effort is normal. No respiratory distress.     Breath sounds: No wheezing, rhonchi or rales.  Abdominal:     General: Abdomen is flat. Bowel sounds are normal. There is no distension.     Palpations: Abdomen is soft.     Tenderness: There is no abdominal tenderness.  Musculoskeletal:     Right lower leg: No edema.     Left lower leg: No edema.  Skin:    General: Skin is warm and dry.     Findings: No rash.  Neurological:     Mental Status: He is alert.        Assessment & Plan:   Mild episode of recurrent major depressive disorder (HCC) He states he has been doing well and wants to cut back on his medications.  I am not going to stop his seroquel but  he wants to cut back on this.  I am going to wean him off sertraline over the next 2 months.  We will see him back in 3 months.    Return in about 3 months (around 12/21/2023) for annual.   Crist Fat, MD

## 2023-09-20 NOTE — Assessment & Plan Note (Signed)
He states he has been doing well and wants to cut back on his medications.  I am not going to stop his seroquel but he wants to cut back on this.  I am going to wean him off sertraline over the next 2 months.  We will see him back in 3 months.

## 2023-10-09 ENCOUNTER — Other Ambulatory Visit: Payer: Self-pay | Admitting: Internal Medicine

## 2023-12-31 ENCOUNTER — Encounter: Payer: No Typology Code available for payment source | Admitting: Internal Medicine

## 2024-04-22 DIAGNOSIS — Z2839 Other underimmunization status: Secondary | ICD-10-CM | POA: Insufficient documentation

## 2024-04-22 NOTE — Assessment & Plan Note (Signed)
 He is going down on his dose of seroquel  and is doing well.

## 2024-04-22 NOTE — Assessment & Plan Note (Signed)
 We will obtain titers for MMR, Hep B and varicella and place a TB skin test today.
